# Patient Record
Sex: Male | Born: 1950 | Race: White | Hispanic: No | Smoking: Current every day smoker
Health system: Southern US, Community
[De-identification: ages and names within clinical notes are randomized; demographics above are authoritative.]

## PROBLEM LIST (undated history)

## (undated) DIAGNOSIS — E785 Hyperlipidemia, unspecified: Secondary | ICD-10-CM

## (undated) DIAGNOSIS — I252 Old myocardial infarction: Secondary | ICD-10-CM

## (undated) DIAGNOSIS — I10 Essential (primary) hypertension: Secondary | ICD-10-CM

## (undated) HISTORY — DX: Essential (primary) hypertension: I10

## (undated) HISTORY — DX: Hyperlipidemia, unspecified: E78.5

## (undated) HISTORY — DX: Old myocardial infarction: I25.2

---

## 1998-05-24 HISTORY — PX: CORONARY ANGIOPLASTY WITH STENT PLACEMENT: SHX49

## 2001-05-23 ENCOUNTER — Ambulatory Visit (HOSPITAL_COMMUNITY): Admission: RE | Admit: 2001-05-23 | Discharge: 2001-05-23 | Payer: Self-pay | Admitting: Internal Medicine

## 2001-05-24 ENCOUNTER — Encounter: Payer: Self-pay | Admitting: Internal Medicine

## 2001-08-19 ENCOUNTER — Ambulatory Visit (HOSPITAL_COMMUNITY): Admission: RE | Admit: 2001-08-19 | Discharge: 2001-08-19 | Payer: Self-pay | Admitting: Internal Medicine

## 2001-08-19 ENCOUNTER — Encounter: Payer: Self-pay | Admitting: Internal Medicine

## 2002-01-09 ENCOUNTER — Ambulatory Visit (HOSPITAL_COMMUNITY): Admission: RE | Admit: 2002-01-09 | Discharge: 2002-01-09 | Payer: Self-pay | Admitting: Internal Medicine

## 2002-08-08 ENCOUNTER — Encounter (HOSPITAL_COMMUNITY): Admission: RE | Admit: 2002-08-08 | Discharge: 2002-09-07 | Payer: Self-pay | Admitting: Pulmonary Disease

## 2002-08-17 ENCOUNTER — Encounter: Payer: Self-pay | Admitting: Emergency Medicine

## 2002-08-17 ENCOUNTER — Emergency Department (HOSPITAL_COMMUNITY): Admission: EM | Admit: 2002-08-17 | Discharge: 2002-08-17 | Payer: Self-pay | Admitting: Emergency Medicine

## 2002-11-29 ENCOUNTER — Emergency Department (HOSPITAL_COMMUNITY): Admission: EM | Admit: 2002-11-29 | Discharge: 2002-11-29 | Payer: Self-pay | Admitting: *Deleted

## 2003-04-05 ENCOUNTER — Ambulatory Visit (HOSPITAL_COMMUNITY): Admission: RE | Admit: 2003-04-05 | Discharge: 2003-04-05 | Payer: Self-pay | Admitting: Gastroenterology

## 2005-04-20 ENCOUNTER — Ambulatory Visit (HOSPITAL_COMMUNITY): Admission: RE | Admit: 2005-04-20 | Discharge: 2005-04-20 | Payer: Self-pay | Admitting: Family Medicine

## 2005-05-28 ENCOUNTER — Ambulatory Visit: Payer: Self-pay | Admitting: Internal Medicine

## 2009-01-14 HISTORY — PX: US ECHOCARDIOGRAPHY: HXRAD669

## 2010-09-14 ENCOUNTER — Encounter: Payer: Self-pay | Admitting: Family Medicine

## 2010-09-14 ENCOUNTER — Encounter: Payer: Self-pay | Admitting: Internal Medicine

## 2010-11-13 HISTORY — PX: NM MYOCAR PERF WALL MOTION: HXRAD629

## 2011-05-25 ENCOUNTER — Ambulatory Visit (HOSPITAL_COMMUNITY)
Admission: RE | Admit: 2011-05-25 | Discharge: 2011-05-25 | Disposition: A | Discharge status: Home / Self Care | Source: Ambulatory Visit | Attending: Family Medicine | Admitting: Family Medicine

## 2011-05-25 ENCOUNTER — Other Ambulatory Visit (HOSPITAL_COMMUNITY): Payer: Self-pay | Admitting: Family Medicine

## 2011-05-25 DIAGNOSIS — R52 Pain, unspecified: Secondary | ICD-10-CM

## 2011-05-25 DIAGNOSIS — M25579 Pain in unspecified ankle and joints of unspecified foot: Secondary | ICD-10-CM | POA: Insufficient documentation

## 2015-02-28 ENCOUNTER — Encounter: Payer: Self-pay | Admitting: *Deleted

## 2015-03-21 ENCOUNTER — Encounter: Payer: Self-pay | Admitting: Cardiovascular Disease

## 2016-03-24 ENCOUNTER — Encounter (HOSPITAL_COMMUNITY): Payer: Self-pay | Admitting: *Deleted

## 2016-03-24 ENCOUNTER — Emergency Department (HOSPITAL_COMMUNITY): Payer: Medicare Other | Admitting: Anesthesiology

## 2016-03-24 ENCOUNTER — Ambulatory Visit (HOSPITAL_COMMUNITY)
Admission: EM | Admit: 2016-03-24 | Discharge: 2016-03-25 | Disposition: A | Discharge status: Home / Self Care | Payer: Medicare Other | Attending: Emergency Medicine | Admitting: Emergency Medicine

## 2016-03-24 ENCOUNTER — Encounter (HOSPITAL_COMMUNITY)
Admission: EM | Disposition: A | Discharge status: Home / Self Care | Payer: Self-pay | Source: Home / Self Care | Attending: Emergency Medicine

## 2016-03-24 ENCOUNTER — Emergency Department (HOSPITAL_COMMUNITY): Payer: Medicare Other

## 2016-03-24 DIAGNOSIS — Z955 Presence of coronary angioplasty implant and graft: Secondary | ICD-10-CM | POA: Diagnosis not present

## 2016-03-24 DIAGNOSIS — S6992XA Unspecified injury of left wrist, hand and finger(s), initial encounter: Secondary | ICD-10-CM

## 2016-03-24 DIAGNOSIS — Z23 Encounter for immunization: Secondary | ICD-10-CM | POA: Diagnosis not present

## 2016-03-24 DIAGNOSIS — Y9389 Activity, other specified: Secondary | ICD-10-CM | POA: Insufficient documentation

## 2016-03-24 DIAGNOSIS — F17210 Nicotine dependence, cigarettes, uncomplicated: Secondary | ICD-10-CM | POA: Insufficient documentation

## 2016-03-24 DIAGNOSIS — Y998 Other external cause status: Secondary | ICD-10-CM | POA: Insufficient documentation

## 2016-03-24 DIAGNOSIS — E785 Hyperlipidemia, unspecified: Secondary | ICD-10-CM | POA: Diagnosis not present

## 2016-03-24 DIAGNOSIS — Z7982 Long term (current) use of aspirin: Secondary | ICD-10-CM | POA: Diagnosis not present

## 2016-03-24 DIAGNOSIS — I10 Essential (primary) hypertension: Secondary | ICD-10-CM | POA: Insufficient documentation

## 2016-03-24 DIAGNOSIS — Z79899 Other long term (current) drug therapy: Secondary | ICD-10-CM | POA: Insufficient documentation

## 2016-03-24 DIAGNOSIS — W3300XA Accidental discharge of unspecified larger firearm, initial encounter: Secondary | ICD-10-CM | POA: Diagnosis not present

## 2016-03-24 DIAGNOSIS — I252 Old myocardial infarction: Secondary | ICD-10-CM | POA: Diagnosis not present

## 2016-03-24 DIAGNOSIS — S68621A Partial traumatic transphalangeal amputation of left index finger, initial encounter: Secondary | ICD-10-CM | POA: Diagnosis not present

## 2016-03-24 HISTORY — PX: AMPUTATION: SHX166

## 2016-03-24 LAB — CBC WITH DIFFERENTIAL/PLATELET
Basophils Absolute: 0 10*3/uL (ref 0.0–0.1)
Basophils Relative: 0 %
EOS PCT: 1 %
Eosinophils Absolute: 0.1 10*3/uL (ref 0.0–0.7)
HEMATOCRIT: 40.3 % (ref 39.0–52.0)
Hemoglobin: 14 g/dL (ref 13.0–17.0)
LYMPHS ABS: 2 10*3/uL (ref 0.7–4.0)
LYMPHS PCT: 20 %
MCH: 32.5 pg (ref 26.0–34.0)
MCHC: 34.7 g/dL (ref 30.0–36.0)
MCV: 93.5 fL (ref 78.0–100.0)
MONO ABS: 0.7 10*3/uL (ref 0.1–1.0)
Monocytes Relative: 7 %
NEUTROS ABS: 7.1 10*3/uL (ref 1.7–7.7)
Neutrophils Relative %: 72 %
PLATELETS: 268 10*3/uL (ref 150–400)
RBC: 4.31 MIL/uL (ref 4.22–5.81)
RDW: 13 % (ref 11.5–15.5)
WBC: 9.9 10*3/uL (ref 4.0–10.5)

## 2016-03-24 LAB — BASIC METABOLIC PANEL
ANION GAP: 5 (ref 5–15)
BUN: 19 mg/dL (ref 6–20)
CHLORIDE: 108 mmol/L (ref 101–111)
CO2: 23 mmol/L (ref 22–32)
Calcium: 9.2 mg/dL (ref 8.9–10.3)
Creatinine, Ser: 1.13 mg/dL (ref 0.61–1.24)
GFR calc Af Amer: >60 mL/min (ref >60)
GFR calc non Af Amer: >60 mL/min (ref >60)
GLUCOSE: 130 mg/dL — AB (ref 65–99)
POTASSIUM: 3.6 mmol/L (ref 3.5–5.1)
Sodium: 136 mmol/L (ref 135–145)

## 2016-03-24 SURGERY — AMPUTATION DIGIT
Anesthesia: General | Site: Finger | Laterality: Left

## 2016-03-24 MED ORDER — DEXAMETHASONE SODIUM PHOSPHATE 10 MG/ML IJ SOLN
INTRAMUSCULAR | Status: DC | PRN
  Administered 2016-03-24: 10 mg via INTRAVENOUS

## 2016-03-24 MED ORDER — MIDAZOLAM HCL 2 MG/2ML IJ SOLN
INTRAMUSCULAR | Status: AC
  Filled 2016-03-24: qty 2

## 2016-03-24 MED ORDER — ONDANSETRON HCL 4 MG/2ML IJ SOLN
INTRAMUSCULAR | Status: AC
  Filled 2016-03-24: qty 2

## 2016-03-24 MED ORDER — TETANUS-DIPHTH-ACELL PERTUSSIS 5-2.5-18.5 LF-MCG/0.5 IM SUSP
0.5000 mL | Freq: Once | INTRAMUSCULAR | Status: AC
  Administered 2016-03-24: 0.5 mL via INTRAMUSCULAR
  Filled 2016-03-24: qty 0.5

## 2016-03-24 MED ORDER — OXYCODONE HCL 5 MG/5ML PO SOLN: 5.0000 mg | Freq: Once | ORAL | Status: DC | PRN

## 2016-03-24 MED ORDER — LIDOCAINE 2% (20 MG/ML) 5 ML SYRINGE
INTRAMUSCULAR | Status: AC
  Filled 2016-03-24: qty 5

## 2016-03-24 MED ORDER — ONDANSETRON HCL 4 MG/2ML IJ SOLN
4.0000 mg | Freq: Once | INTRAMUSCULAR | Status: AC
  Administered 2016-03-24: 4 mg via INTRAVENOUS
  Filled 2016-03-24: qty 2

## 2016-03-24 MED ORDER — MORPHINE SULFATE (PF) 4 MG/ML IV SOLN
4.0000 mg | Freq: Once | INTRAVENOUS | Status: AC
  Administered 2016-03-24: 4 mg via INTRAVENOUS
  Filled 2016-03-24: qty 1

## 2016-03-24 MED ORDER — LACTATED RINGERS IV SOLN
INTRAVENOUS | Status: DC | PRN
  Administered 2016-03-24 (×2): via INTRAVENOUS

## 2016-03-24 MED ORDER — CEFAZOLIN IN D5W 1 GM/50ML IV SOLN
INTRAVENOUS | Status: DC | PRN
  Administered 2016-03-24: 1 g via INTRAVENOUS

## 2016-03-24 MED ORDER — EPHEDRINE 5 MG/ML INJ
INTRAVENOUS | Status: AC
  Filled 2016-03-24: qty 10

## 2016-03-24 MED ORDER — PROMETHAZINE HCL 25 MG/ML IJ SOLN: 6.2500 mg | INTRAMUSCULAR | Status: DC | PRN

## 2016-03-24 MED ORDER — MIDAZOLAM HCL 2 MG/2ML IJ SOLN
INTRAMUSCULAR | Status: DC | PRN
  Administered 2016-03-24: 2 mg via INTRAVENOUS

## 2016-03-24 MED ORDER — SUCCINYLCHOLINE CHLORIDE 20 MG/ML IJ SOLN
INTRAMUSCULAR | Status: DC | PRN
  Administered 2016-03-24: 100 mg via INTRAVENOUS

## 2016-03-24 MED ORDER — CEFAZOLIN IN D5W 1 GM/50ML IV SOLN
1.0000 g | Freq: Once | INTRAVENOUS | Status: AC
  Administered 2016-03-24: 1 g via INTRAVENOUS
  Filled 2016-03-24: qty 50

## 2016-03-24 MED ORDER — CEPHALEXIN 500 MG PO CAPS: 500.0000 mg | ORAL_CAPSULE | Freq: Four times a day (QID) | ORAL | 0 refills | Status: DC

## 2016-03-24 MED ORDER — OXYCODONE HCL 5 MG PO TABS: 10.0000 mg | ORAL_TABLET | ORAL | 0 refills | Status: DC | PRN

## 2016-03-24 MED ORDER — SUCCINYLCHOLINE CHLORIDE 200 MG/10ML IV SOSY
PREFILLED_SYRINGE | INTRAVENOUS | Status: AC
  Filled 2016-03-24: qty 10

## 2016-03-24 MED ORDER — 0.9 % SODIUM CHLORIDE (POUR BTL) OPTIME
TOPICAL | Status: DC | PRN
  Administered 2016-03-24: 1000 mL

## 2016-03-24 MED ORDER — SODIUM CHLORIDE 0.9 % IR SOLN
Status: DC | PRN
  Administered 2016-03-24: 3000 mL

## 2016-03-24 MED ORDER — ONDANSETRON HCL 4 MG/2ML IJ SOLN
INTRAMUSCULAR | Status: DC | PRN
  Administered 2016-03-24: 4 mg via INTRAVENOUS

## 2016-03-24 MED ORDER — BUPIVACAINE HCL (PF) 0.25 % IJ SOLN
INTRAMUSCULAR | Status: AC
  Filled 2016-03-24: qty 30

## 2016-03-24 MED ORDER — EPHEDRINE SULFATE 50 MG/ML IJ SOLN
INTRAMUSCULAR | Status: DC | PRN
  Administered 2016-03-24 (×2): 10 mg via INTRAVENOUS
  Administered 2016-03-24: 15 mg via INTRAVENOUS

## 2016-03-24 MED ORDER — FENTANYL CITRATE (PF) 250 MCG/5ML IJ SOLN
INTRAMUSCULAR | Status: AC
  Filled 2016-03-24: qty 5

## 2016-03-24 MED ORDER — FENTANYL CITRATE (PF) 100 MCG/2ML IJ SOLN
50.0000 ug | Freq: Once | INTRAMUSCULAR | Status: AC
  Administered 2016-03-24: 50 ug via INTRAVENOUS
  Filled 2016-03-24: qty 2

## 2016-03-24 MED ORDER — LIDOCAINE HCL (CARDIAC) 20 MG/ML IV SOLN
INTRAVENOUS | Status: DC | PRN
  Administered 2016-03-24: 80 mg via INTRATRACHEAL

## 2016-03-24 MED ORDER — PROPOFOL 10 MG/ML IV BOLUS
INTRAVENOUS | Status: AC
  Filled 2016-03-24: qty 20

## 2016-03-24 MED ORDER — DEXAMETHASONE SODIUM PHOSPHATE 10 MG/ML IJ SOLN
INTRAMUSCULAR | Status: AC
  Filled 2016-03-24: qty 1

## 2016-03-24 MED ORDER — FENTANYL CITRATE (PF) 100 MCG/2ML IJ SOLN: 25.0000 ug | INTRAMUSCULAR | Status: DC | PRN

## 2016-03-24 MED ORDER — PROPOFOL 10 MG/ML IV BOLUS
INTRAVENOUS | Status: DC | PRN
  Administered 2016-03-24: 200 mg via INTRAVENOUS

## 2016-03-24 MED ORDER — FENTANYL CITRATE (PF) 250 MCG/5ML IJ SOLN
INTRAMUSCULAR | Status: DC | PRN
  Administered 2016-03-24: 50 ug via INTRAVENOUS

## 2016-03-24 MED ORDER — OXYCODONE HCL 5 MG PO TABS: 5.0000 mg | ORAL_TABLET | Freq: Once | ORAL | Status: DC | PRN

## 2016-03-24 MED ORDER — SODIUM CHLORIDE 0.9 % IV BOLUS (SEPSIS)
1000.0000 mL | Freq: Once | INTRAVENOUS | Status: AC
  Administered 2016-03-24: 1000 mL via INTRAVENOUS

## 2016-03-24 MED ORDER — BUPIVACAINE HCL (PF) 0.25 % IJ SOLN
INTRAMUSCULAR | Status: DC | PRN
Start: 2016-03-24 — End: 2016-03-24
  Administered 2016-03-24: 10 mL

## 2016-03-24 SURGICAL SUPPLY — 55 items
BANDAGE COBAN STERILE 2 (GAUZE/BANDAGES/DRESSINGS) ×2 IMPLANT
BANDAGE ELASTIC 3 VELCRO ST LF (GAUZE/BANDAGES/DRESSINGS) IMPLANT
BANDAGE ELASTIC 4 VELCRO ST LF (GAUZE/BANDAGES/DRESSINGS) IMPLANT
BLADE 15 SAFETY STRL DISP (BLADE) ×4 IMPLANT
BNDG COHESIVE 1X5 TAN STRL LF (GAUZE/BANDAGES/DRESSINGS) ×2 IMPLANT
BNDG CONFORM 2 STRL LF (GAUZE/BANDAGES/DRESSINGS) IMPLANT
BNDG GAUZE ELAST 4 BULKY (GAUZE/BANDAGES/DRESSINGS) ×4 IMPLANT
CORDS BIPOLAR (ELECTRODE) ×3 IMPLANT
COVER SURGICAL LIGHT HANDLE (MISCELLANEOUS) ×3 IMPLANT
CUFF TOURNIQUET SINGLE 18IN (TOURNIQUET CUFF) ×3 IMPLANT
CUFF TOURNIQUET SINGLE 24IN (TOURNIQUET CUFF) IMPLANT
DRAPE SURG 17X23 STRL (DRAPES) ×3 IMPLANT
DRSG ADAPTIC 3X8 NADH LF (GAUZE/BANDAGES/DRESSINGS) ×2 IMPLANT
GAUZE SPONGE 2X2 8PLY STRL LF (GAUZE/BANDAGES/DRESSINGS) IMPLANT
GAUZE SPONGE 4X4 12PLY STRL (GAUZE/BANDAGES/DRESSINGS) ×2 IMPLANT
GAUZE XEROFORM 1X8 LF (GAUZE/BANDAGES/DRESSINGS) IMPLANT
GAUZE XEROFORM 5X9 LF (GAUZE/BANDAGES/DRESSINGS) ×3 IMPLANT
GLOVE BIOGEL M 8.0 STRL (GLOVE) ×3 IMPLANT
GLOVE BIOGEL PI IND STRL 7.5 (GLOVE) IMPLANT
GLOVE BIOGEL PI INDICATOR 7.5 (GLOVE) ×2
GLOVE SS BIOGEL STRL SZ 8 (GLOVE) ×1 IMPLANT
GLOVE SUPERSENSE BIOGEL SZ 8 (GLOVE) ×2
GOWN STRL REUS W/ TWL LRG LVL3 (GOWN DISPOSABLE) ×1 IMPLANT
GOWN STRL REUS W/ TWL XL LVL3 (GOWN DISPOSABLE) ×2 IMPLANT
GOWN STRL REUS W/TWL LRG LVL3 (GOWN DISPOSABLE) ×3
GOWN STRL REUS W/TWL XL LVL3 (GOWN DISPOSABLE) ×6
KIT BASIN OR (CUSTOM PROCEDURE TRAY) ×3 IMPLANT
KIT ROOM TURNOVER OR (KITS) ×3 IMPLANT
MANIFOLD NEPTUNE II (INSTRUMENTS) ×3 IMPLANT
NDL HYPO 25GX1X1/2 BEV (NEEDLE) IMPLANT
NEEDLE HYPO 25GX1X1/2 BEV (NEEDLE) ×3 IMPLANT
NS IRRIG 1000ML POUR BTL (IV SOLUTION) ×3 IMPLANT
PACK ORTHO EXTREMITY (CUSTOM PROCEDURE TRAY) ×3 IMPLANT
PAD ARMBOARD 7.5X6 YLW CONV (MISCELLANEOUS) ×6 IMPLANT
PAD CAST 3X4 CTTN HI CHSV (CAST SUPPLIES) IMPLANT
PAD CAST 4YDX4 CTTN HI CHSV (CAST SUPPLIES) IMPLANT
PADDING CAST COTTON 3X4 STRL (CAST SUPPLIES) ×3
PADDING CAST COTTON 4X4 STRL (CAST SUPPLIES)
SET CYSTO W/LG BORE CLAMP LF (SET/KITS/TRAYS/PACK) ×2 IMPLANT
SOLUTION BETADINE 4OZ (MISCELLANEOUS) ×3 IMPLANT
SPECIMEN JAR SMALL (MISCELLANEOUS) IMPLANT
SPLINT FINGER 6.25 W/BULB ALUM (SOFTGOODS) ×2 IMPLANT
SPONGE GAUZE 2X2 STER 10/PKG (GAUZE/BANDAGES/DRESSINGS)
SPONGE GAUZE 4X4 12PLY STER LF (GAUZE/BANDAGES/DRESSINGS) ×2 IMPLANT
SPONGE SCRUB IODOPHOR (GAUZE/BANDAGES/DRESSINGS) ×3 IMPLANT
SUT MERSILENE 4 0 P 3 (SUTURE) IMPLANT
SUT PROLENE 4 0 PS 2 18 (SUTURE) IMPLANT
SUT PROLENE 5 0 PS 2 (SUTURE) ×6 IMPLANT
SYR CONTROL 10ML LL (SYRINGE) ×3 IMPLANT
TOWEL OR 17X24 6PK STRL BLUE (TOWEL DISPOSABLE) ×3 IMPLANT
TOWEL OR 17X26 10 PK STRL BLUE (TOWEL DISPOSABLE) ×3 IMPLANT
TUBE CONNECTING 12'X1/4 (SUCTIONS) ×1
TUBE CONNECTING 12X1/4 (SUCTIONS) ×1 IMPLANT
UNDERPAD 30X30 INCONTINENT (UNDERPADS AND DIAPERS) ×3 IMPLANT
WATER STERILE IRR 1000ML POUR (IV SOLUTION) ×3 IMPLANT
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
## 2016-03-24 NOTE — Anesthesia Procedure Notes (Signed)
Procedure Name: Intubation Date/Time: 03/24/2016 10:43 PM Performed by: Molli Hazard Pre-anesthesia Checklist: Patient identified, Emergency Drugs available, Suction available and Patient being monitored Patient Re-evaluated:Patient Re-evaluated prior to inductionOxygen Delivery Method: Circle system utilized Preoxygenation: Pre-oxygenation with 100% oxygen Intubation Type: IV induction, Rapid sequence and Cricoid Pressure applied Laryngoscope Size: Miller and 2 Grade View: Grade I Tube type: Oral Tube size: 7.5 mm Number of attempts: 1 Airway Equipment and Method: Stylet Placement Confirmation: ETT inserted through vocal cords under direct vision,  positive ETCO2 and breath sounds checked- equal and bilateral Secured at: 23 cm Tube secured with: Tape Dental Injury: Teeth and Oropharynx as per pre-operative assessment

## 2016-03-24 NOTE — ED Notes (Signed)
Patient requesting pain medication.
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
## 2016-03-24 NOTE — Transfer of Care (Signed)
Immediate Anesthesia Transfer of Care Note  Patient: James Ortega  Procedure(s) Performed: Procedure(s): REVISION AMPUTATION LEFT INDEX FINGER (Left)  Patient Location: PACU  Anesthesia Type:General  Level of Consciousness: awake and alert   Airway & Oxygen Therapy: Patient connected to nasal cannula oxygen  Post-op Assessment: Report given to RN, Post -op Vital signs reviewed and stable and Patient moving all extremities X 4  Post vital signs: Reviewed and stable  Last Vitals:  Vitals:   03/24/16 2045 03/24/16 2151  BP: 120/81   Pulse: 65   Resp: 13   Temp:  36.5 C    Last Pain:  Vitals:   03/24/16 2151  TempSrc: Oral  PainSc:          Complications: No apparent anesthesia complications

## 2016-03-24 NOTE — Anesthesia Preprocedure Evaluation (Addendum)
Anesthesia Evaluation  Patient identified by MRN, date of birth, ID band Patient awake    Reviewed: Allergy & Precautions, NPO status , Patient's Chart, lab work & pertinent test results  Airway Mallampati: II  TM Distance: >3 FB Neck ROM: Full    Dental  (+) Dental Advisory Given, Edentulous Lower, Partial Upper   Pulmonary Current Smoker,    breath sounds clear to auscultation       Cardiovascular hypertension, Pt. on medications and Pt. on home beta blockers + CAD and + Cardiac Stents   Rhythm:Regular Rate:Normal     Neuro/Psych    GI/Hepatic negative GI ROS, Neg liver ROS,   Endo/Other  negative endocrine ROS  Renal/GU negative Renal ROS     Musculoskeletal   Abdominal   Peds  Hematology negative hematology ROS (+)   Anesthesia Other Findings   Reproductive/Obstetrics                           Lab Results  Component Value Date   WBC 9.9 03/24/2016   HGB 14.0 03/24/2016   HCT 40.3 03/24/2016   MCV 93.5 03/24/2016   PLT 268 03/24/2016   Lab Results  Component Value Date   CREATININE 1.13 03/24/2016   BUN 19 03/24/2016   NA 136 03/24/2016   K 3.6 03/24/2016   CL 108 03/24/2016   CO2 23 03/24/2016    Anesthesia Physical Anesthesia Plan  ASA: III and emergent  Anesthesia Plan: General   Post-op Pain Management:    Induction: Intravenous and Rapid sequence  Airway Management Planned: Oral ETT  Additional Equipment:   Intra-op Plan:   Post-operative Plan: Extubation in OR  Informed Consent: I have reviewed the patients History and Physical, chart, labs and discussed the procedure including the risks, benefits and alternatives for the proposed anesthesia with the patient or authorized representative who has indicated his/her understanding and acceptance.   Dental advisory given  Plan Discussed with: CRNA  Anesthesia Plan Comments:        Anesthesia Quick  Evaluation

## 2016-03-25 ENCOUNTER — Encounter (HOSPITAL_COMMUNITY): Payer: Self-pay | Admitting: Orthopedic Surgery

## 2016-03-25 DIAGNOSIS — S68621A Partial traumatic transphalangeal amputation of left index finger, initial encounter: Secondary | ICD-10-CM | POA: Diagnosis not present
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
## 2016-03-25 NOTE — Anesthesia Postprocedure Evaluation (Signed)
Anesthesia Post Note  Patient: James Ortega  Procedure(s) Performed: Procedure(s) (LRB): REVISION AMPUTATION LEFT INDEX FINGER (Left)  Patient location during evaluation: PACU Anesthesia Type: General Level of consciousness: awake and alert Pain management: pain level controlled Vital Signs Assessment: post-procedure vital signs reviewed and stable Respiratory status: spontaneous breathing, nonlabored ventilation, respiratory function stable and patient connected to nasal cannula oxygen Cardiovascular status: blood pressure returned to baseline and stable Postop Assessment: no signs of nausea or vomiting Anesthetic complications: no    Last Vitals:  Vitals:   03/25/16 0015 03/25/16 0024  BP: 135/87 (!) 136/91  Pulse: 84 83  Resp: 16 (!) 0  Temp:  36.3 C    Last Pain:  Vitals:   03/25/16 0024  TempSrc:   PainSc: 0-No pain                 Kennieth Rad

## 2016-11-21 ENCOUNTER — Emergency Department (HOSPITAL_COMMUNITY)
Admission: EM | Admit: 2016-11-21 | Discharge: 2016-11-22 | Disposition: A | Discharge status: Home / Self Care | Payer: Medicare Other | Attending: Emergency Medicine | Admitting: Emergency Medicine

## 2016-11-21 ENCOUNTER — Encounter (HOSPITAL_COMMUNITY): Payer: Self-pay

## 2016-11-21 ENCOUNTER — Emergency Department (HOSPITAL_COMMUNITY): Payer: Medicare Other

## 2016-11-21 DIAGNOSIS — F17210 Nicotine dependence, cigarettes, uncomplicated: Secondary | ICD-10-CM | POA: Diagnosis not present

## 2016-11-21 DIAGNOSIS — S022XXA Fracture of nasal bones, initial encounter for closed fracture: Secondary | ICD-10-CM

## 2016-11-21 DIAGNOSIS — Y9389 Activity, other specified: Secondary | ICD-10-CM | POA: Insufficient documentation

## 2016-11-21 DIAGNOSIS — S0181XA Laceration without foreign body of other part of head, initial encounter: Secondary | ICD-10-CM | POA: Diagnosis not present

## 2016-11-21 DIAGNOSIS — Z79899 Other long term (current) drug therapy: Secondary | ICD-10-CM | POA: Insufficient documentation

## 2016-11-21 DIAGNOSIS — I10 Essential (primary) hypertension: Secondary | ICD-10-CM | POA: Diagnosis not present

## 2016-11-21 DIAGNOSIS — Y929 Unspecified place or not applicable: Secondary | ICD-10-CM | POA: Insufficient documentation

## 2016-11-21 DIAGNOSIS — S0990XA Unspecified injury of head, initial encounter: Secondary | ICD-10-CM

## 2016-11-21 DIAGNOSIS — Y999 Unspecified external cause status: Secondary | ICD-10-CM | POA: Insufficient documentation

## 2016-11-21 DIAGNOSIS — S0101XA Laceration without foreign body of scalp, initial encounter: Secondary | ICD-10-CM | POA: Diagnosis not present

## 2016-11-21 DIAGNOSIS — S0083XA Contusion of other part of head, initial encounter: Secondary | ICD-10-CM
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
## 2016-11-22 DIAGNOSIS — S022XXA Fracture of nasal bones, initial encounter for closed fracture: Secondary | ICD-10-CM | POA: Diagnosis not present

## 2016-11-22 MED ORDER — LIDOCAINE HCL (PF) 2 % IJ SOLN
10.0000 mL | Freq: Once | INTRAMUSCULAR | Status: DC
  Filled 2016-11-22: qty 10

## 2016-11-22 MED ORDER — HYDROCODONE-ACETAMINOPHEN 5-325 MG PO TABS
2.0000 | ORAL_TABLET | Freq: Once | ORAL | Status: AC
  Administered 2016-11-22: 2 via ORAL
  Filled 2016-11-22: qty 2

## 2016-11-22 MED ORDER — POVIDONE-IODINE 10 % EX SOLN
CUTANEOUS | Status: AC
  Filled 2016-11-22: qty 118

## 2016-11-22 MED ORDER — HYDROCODONE-ACETAMINOPHEN 5-325 MG PO TABS: 2.0000 | ORAL_TABLET | ORAL | 0 refills | Status: DC | PRN
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
## 2016-11-22 NOTE — ED Notes (Signed)
EDP at bedside  

## 2016-11-22 NOTE — ED Notes (Signed)
Pt ambulatory to waiting room with steady gait. NAD. Pt verbalized understanding of discharge instructions.

## 2016-11-22 NOTE — Discharge Instructions (Signed)
Return if any problems.

## 2018-05-10 ENCOUNTER — Ambulatory Visit: Admitting: Cardiovascular Disease

## 2018-05-11 ENCOUNTER — Ambulatory Visit (INDEPENDENT_AMBULATORY_CARE_PROVIDER_SITE_OTHER): Payer: Medicare Other | Admitting: Cardiovascular Disease

## 2018-05-11 ENCOUNTER — Encounter: Payer: Self-pay | Admitting: Cardiovascular Disease

## 2018-05-11 VITALS — BP 110/80 | HR 65 | Ht 70.0 in | Wt 168.5 lb

## 2018-05-11 DIAGNOSIS — I10 Essential (primary) hypertension: Secondary | ICD-10-CM | POA: Diagnosis not present

## 2018-05-11 DIAGNOSIS — I2510 Atherosclerotic heart disease of native coronary artery without angina pectoris: Secondary | ICD-10-CM | POA: Insufficient documentation

## 2018-05-11 DIAGNOSIS — I255 Ischemic cardiomyopathy: Secondary | ICD-10-CM | POA: Insufficient documentation

## 2018-05-11 DIAGNOSIS — Z720 Tobacco use: Secondary | ICD-10-CM | POA: Insufficient documentation

## 2018-05-11 DIAGNOSIS — Z23 Encounter for immunization: Secondary | ICD-10-CM

## 2018-05-11 DIAGNOSIS — E785 Hyperlipidemia, unspecified: Secondary | ICD-10-CM | POA: Insufficient documentation

## 2018-05-11 DIAGNOSIS — I519 Heart disease, unspecified: Secondary | ICD-10-CM

## 2018-05-11 DIAGNOSIS — E7800 Pure hypercholesterolemia, unspecified: Secondary | ICD-10-CM | POA: Diagnosis not present

## 2018-05-11 NOTE — Patient Instructions (Signed)
Medication Instructions:  Your physician recommends that you continue on your current medications as directed. Please refer to the Current Medication list given to you today.   Labwork: none  Testing/Procedures: Your physician has requested that you have an echocardiogram. Echocardiography is a painless test that uses sound waves to create images of your heart. It provides your doctor with information about the size and shape of your heart and how well your heart's chambers and valves are working. This procedure takes approximately one hour. There are no restrictions for this procedure.    Follow-Up: Your physician wants you to follow-up in: 12 months with Dr. Berry. You will receive a reminder letter in the mail two months in advance. If you don't receive a letter, please call our office to schedule the follow-up appointment.   Any Other Special Instructions Will Be Listed Below (If Applicable).     If you need a refill on your cardiac medications before your next appointment, please call your pharmacy.   

## 2018-05-11 NOTE — Assessment & Plan Note (Signed)
History of hyperlipidemia on statin therapy. 

## 2018-05-11 NOTE — Progress Notes (Signed)
05/11/2018 James Ortega   1950/09/08  [id number]  Primary Physician Oval Linsey, MD Primary Cardiologist: Runell Gess MD Nicholes Calamity, [place]  HPI:  James Ortega is a 67 y.o. thin appearing married Caucasian male father of 2 children who is retired Emergency planning/management officer and prison guard who was in [employer] as well.  He is self-referred to be established to my practice is a former patient.  His risk factors include 50 pack years tobacco abuse continue to smoke 1 pack/day, treated hypertension and hyperlipidemia.  He had anterior MI 1999 in [place] him and had an LAD stent at that time.  There is no family history of heart disease.  He denies chest pain or shortness of breath.  2D echo performed 01/14/2009 revealed EF of 45 to 50% with moderate to severe apical hypokinesia and a Myoview performed 11/13/2010 showed anteroapical scar without ischemia.   Current Meds  Medication Sig  . amLODipine (NORVASC) 5 MG tablet Take 5 mg by mouth daily.  Marland Kitchen apixaban (ELIQUIS) 5 MG TABS tablet Take 5 mg by mouth 2 (two) times daily.  . cephALEXin (KEFLEX) 500 MG capsule Take 1 capsule (500 mg total) by mouth 4 (four) times daily.  . clonazePAM (KLONOPIN) 1 MG tablet Take 1 mg by mouth 2 (two) times daily as needed for anxiety.  Marland Kitchen escitalopram (LEXAPRO) 10 MG tablet Take 10 mg by mouth daily.  . metoprolol succinate (TOPROL-XL) 50 MG 24 hr tablet Take 50 mg by mouth daily. Take with or immediately following a meal.  . omeprazole (PRILOSEC) 20 MG capsule Take 20 mg by mouth daily.  . rosuvastatin (CRESTOR) 20 MG tablet Take 20 mg by mouth daily.  . vitamin E 400 UNIT capsule Take 800 Units by mouth daily.  . [DISCONTINUED] aspirin 81 MG tablet Take 81 mg by mouth daily.  . [DISCONTINUED] HYDROcodone-acetaminophen (NORCO/VICODIN) 5-325 MG tablet Take 2 tablets by mouth every 4 (four) hours as needed.  . [DISCONTINUED] olmesartan (BENICAR) 40 MG tablet Take 40 mg by mouth daily.  . [DISCONTINUED]  oxyCODONE (ROXICODONE) 5 MG immediate release tablet Take 2 tablets (10 mg total) by mouth every 4 (four) hours as needed for severe pain.     No Known Allergies  Social History   Socioeconomic History  . Marital status: Married    Spouse name: Not on file  . Number of children: Not on file  . Years of education: Not on file  . Highest education level: Not on file  Occupational History  . Not on file  Social Needs  . Financial resource strain: Not on file  . Food insecurity:    Worry: Not on file    Inability: Not on file  . Transportation needs:    Medical: Not on file    Non-medical: Not on file  Tobacco Use  . Smoking status: Current Every Day Smoker    Packs/day: 1.00    Types: Cigarettes  . Smokeless tobacco: Never Used  Substance and Sexual Activity  . Alcohol use: No    Alcohol/week: 0.0 standard drinks  . Drug use: No  . Sexual activity: Yes  Lifestyle  . Physical activity:    Days per week: Not on file    Minutes per session: Not on file  . Stress: Not on file  Relationships  . Social connections:    Talks on phone: Not on file    Gets together: Not on file    Attends  religious service: Not on file    Active member of club or organization: Not on file    Attends meetings of clubs or organizations: Not on file    Relationship status: Not on file  . Intimate partner violence:    Fear of current or ex partner: Not on file    Emotionally abused: Not on file    Physically abused: Not on file    Forced sexual activity: Not on file  Other Topics Concern  . Not on file  Social History Narrative  . Not on file     Review of Systems: General: negative for chills, fever, night sweats or weight changes.  Cardiovascular: negative for chest pain, dyspnea on exertion, edema, orthopnea, palpitations, paroxysmal nocturnal dyspnea or shortness of breath Dermatological: negative for rash Respiratory: negative for cough or wheezing Urologic: negative for  hematuria Abdominal: negative for nausea, vomiting, diarrhea, bright red blood per rectum, melena, or hematemesis Neurologic: negative for visual changes, syncope, or dizziness All other systems reviewed and are otherwise negative except as noted above.    Blood pressure 110/80, pulse 65, height 5\' 10"  (1.778 m), weight 168 lb 8 oz (76.4 kg).  General appearance: alert and no distress Neck: no adenopathy, no carotid bruit, no JVD, supple, symmetrical, trachea midline and thyroid not enlarged, symmetric, no tenderness/mass/nodules Lungs: clear to auscultation bilaterally Heart: regular rate and rhythm, S1, S2 normal, no murmur, click, rub or gallop Extremities: extremities normal, atraumatic, no cyanosis or edema Pulses: 2+ and symmetric Skin: Skin color, texture, turgor normal. No rashes or lesions Neurologic: Alert and oriented X 3, normal strength and tone. Normal symmetric reflexes. Normal coordination and gait  EKG sinus rhythm at 65 with Q waves in V2 through V6, anterolateral T wave inversion with left anterior fascicular block and low limb voltage.  I personally reviewed this EKG.  ASSESSMENT AND PLAN:   Coronary artery disease History of CAD status post anterior MI 1999 treated with [place] stenting [place].  He had a Myoview performed 11/13/2010 that showed apical scar and 2D echo revealed EF of 45 to 50% with moderate to severe apical hypokinesia.  He denies chest pain or shortness of breath.  Ischemic cardiomyopathy History of LAD infarct 20 years ago in 1999 with LAD stenting.  Prior echo performed 01/14/2009 which revealed EF of 45 to 50% with an apical wall motion normality.  We will repeat a 2D echocardiogram.  Essential hypertension History of essential hypertension with blood pressure measured today at 110/80.  He is on amlodipine, metoprolol and lisinopril.  Hyperlipidemia History of hyperlipidemia on statin therapy.      Runell GessJonathan J. Leonna Schlee MD FACP,FACC,FAHA,  [facility] 05/11/2018 4:17 PM

## 2018-05-11 NOTE — Assessment & Plan Note (Signed)
History of essential hypertension with blood pressure measured today at 110/80.  He is on amlodipine, metoprolol and lisinopril.

## 2018-05-11 NOTE — Assessment & Plan Note (Signed)
History of LAD infarct 20 years ago in 1999 with LAD stenting.  Prior echo performed 01/14/2009 which revealed EF of 45 to 50% with an apical wall motion normality.  We will repeat a 2D echocardiogram.

## 2018-05-11 NOTE — Assessment & Plan Note (Signed)
History of CAD status post anterior MI 1999 treated with [place] stenting in [place].  He had a Myoview performed 11/13/2010 that showed apical scar and 2D echo revealed EF of 45 to 50% with moderate to severe apical hypokinesia.  He denies chest pain or shortness of breath.

## 2018-05-26 ENCOUNTER — Other Ambulatory Visit (HOSPITAL_COMMUNITY): Payer: Medicare Other

## 2018-06-09 ENCOUNTER — Other Ambulatory Visit: Payer: Self-pay

## 2018-06-09 ENCOUNTER — Ambulatory Visit (HOSPITAL_COMMUNITY): Payer: Medicare Other | Attending: Cardiovascular Disease

## 2018-06-09 DIAGNOSIS — I255 Ischemic cardiomyopathy: Secondary | ICD-10-CM | POA: Insufficient documentation

## 2018-06-09 DIAGNOSIS — I2510 Atherosclerotic heart disease of native coronary artery without angina pectoris: Secondary | ICD-10-CM | POA: Insufficient documentation

## 2018-06-09 DIAGNOSIS — Z720 Tobacco use: Secondary | ICD-10-CM

## 2018-06-09 DIAGNOSIS — E7800 Pure hypercholesterolemia, unspecified: Secondary | ICD-10-CM | POA: Diagnosis present

## 2018-06-09 DIAGNOSIS — I519 Heart disease, unspecified: Secondary | ICD-10-CM

## 2018-06-09 DIAGNOSIS — I10 Essential (primary) hypertension: Secondary | ICD-10-CM | POA: Insufficient documentation

## 2018-07-07 ENCOUNTER — Telehealth: Payer: Self-pay | Admitting: Cardiovascular Disease

## 2018-07-07 NOTE — Telephone Encounter (Signed)
   [facility] Pre-operative Risk Assessment    Request for surgical clearance:  1. What type of surgery is being performed? Cataract extraction with intraocular lens implantation of the right eye followed by the left eye   2. When is this surgery scheduled? Right - 07/14/18, Left - 07/28/18   3. What type of clearance is required (medical clearance vs. Pharmacy clearance to hold med vs. Both)? Medical   4. Are there any medications that need to be held prior to surgery and how long? None - patient is on eliquis    5. Practice name and name of physician performing surgery? Dr. Darleen Crocker @ [facility]   6. What is your office phone number [phone]    7.   What is your office fax number [phone]  8.   Anesthesia type (None, local, MAC, general) ? Topical anesthesia with IV medication    Fidel Levy 07/07/2018, 2:06 PM  _________________________________________________________________   (provider comments below)

## 2018-07-07 NOTE — Telephone Encounter (Signed)
Pharm, please clarify on Eliquis thanks

## 2018-07-08 NOTE — Telephone Encounter (Signed)
Recommend continuing Eliquis for cataract removal per protocol. If the following lens implantation carries additional bleeding risk aside from regular cataract removal, would be ok to hold Eliquis for 1 day prior to procedure as he takes Eliquis for afib with low CHADS2VASc score of 3 (age, HTN, CAD).

## 2018-07-08 NOTE — Telephone Encounter (Signed)
   Primary Cardiologist: No primary care provider on file.  Chart reviewed as part of pre-operative protocol coverage. Given past medical history and time since last visit, based on ACC/AHA guidelines, James Ortega would be at acceptable risk for the planned procedure without further cardiovascular testing.   I will route this recommendation to the requesting party via Epic fax function and remove from pre-op pool.  Please call with questions.  Nada BoozerLaura Cattaleya Wien, NP 07/08/2018, 9:49 AM

## 2018-08-02 ENCOUNTER — Encounter: Payer: Self-pay | Admitting: Gastroenterology

## 2018-08-10 ENCOUNTER — Ambulatory Visit: Payer: Medicare Other | Admitting: Nurse Practitioner

## 2018-09-21 ENCOUNTER — Ambulatory Visit: Payer: Medicare Other | Admitting: Gastroenterology

## 2018-11-09 ENCOUNTER — Ambulatory Visit: Payer: Medicare Other | Admitting: Nurse Practitioner
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
## 2019-06-07 ENCOUNTER — Telehealth: Payer: Self-pay | Admitting: *Deleted

## 2019-06-07 NOTE — Telephone Encounter (Signed)
A message was left, re: his follow up visit. 

## 2020-01-18 ENCOUNTER — Telehealth: Payer: Self-pay | Admitting: Cardiovascular Disease

## 2020-01-18 NOTE — Telephone Encounter (Signed)
New Message  Patient states that he would like to switch his heart care from Dr. Allyson Sabal to one of the Doctors in the [place] office. Patient lives in [place] and also wants to just start over with a new cardiologist. Please confirm that this transfer of care is ok.

## 2020-01-18 NOTE — Telephone Encounter (Signed)
Left spine with

## 2020-01-19 NOTE — Telephone Encounter (Signed)
Ok with me 

## 2020-01-23 NOTE — Telephone Encounter (Signed)
Follow up  Called patient to schedule appointment with Dr. Purvis Sheffield via approved provider switch. Called twice, no answer, unable to leave vm. When patient calls back, please schedule with Dr. Purvis Sheffield in the [place] office.

## 2020-02-20 ENCOUNTER — Ambulatory Visit (INDEPENDENT_AMBULATORY_CARE_PROVIDER_SITE_OTHER): Payer: Medicare Other | Admitting: Internal Medicine

## 2020-03-01 ENCOUNTER — Ambulatory Visit: Payer: Medicare Other | Admitting: Cardiovascular Disease

## 2021-02-11 ENCOUNTER — Telehealth (INDEPENDENT_AMBULATORY_CARE_PROVIDER_SITE_OTHER): Payer: Self-pay

## 2021-02-11 ENCOUNTER — Other Ambulatory Visit (INDEPENDENT_AMBULATORY_CARE_PROVIDER_SITE_OTHER): Payer: Self-pay | Admitting: Internal Medicine

## 2021-02-11 NOTE — Telephone Encounter (Signed)
Call back; notified pt of denied appt. Refer him to [facility]  & given phone number to call now to schedule a appointment.

## 2021-02-11 NOTE — Telephone Encounter (Signed)
Patient was released from Dr. Allyson Sabal last year in May (see below) due to location convenience. He never got an opportunity to see Dr. Purvis Sheffield. Are one of you willing to follow up with him? Please advise when able.

## 2021-02-11 NOTE — Telephone Encounter (Signed)
I have reviewed the chart.  I cannot accept this patient into the practice.

## 2021-02-13 ENCOUNTER — Telehealth: Payer: Self-pay

## 2021-02-13 NOTE — Telephone Encounter (Signed)
error 

## 2021-02-14 ENCOUNTER — Telehealth: Payer: Self-pay | Admitting: Cardiovascular Disease

## 2021-02-14 NOTE — Telephone Encounter (Signed)
Patient would like to do a provider switch to Dr. Jens Som. Please advise

## 2021-02-18 NOTE — Telephone Encounter (Signed)
Follow up scheduled

## 2021-03-10 ENCOUNTER — Ambulatory Visit: Payer: Medicare Other | Admitting: Internal Medicine

## 2021-04-14 ENCOUNTER — Ambulatory Visit (INDEPENDENT_AMBULATORY_CARE_PROVIDER_SITE_OTHER): Payer: Medicare Other | Admitting: Internal Medicine

## 2021-04-14 ENCOUNTER — Encounter: Payer: Self-pay | Admitting: Internal Medicine

## 2021-04-14 ENCOUNTER — Other Ambulatory Visit: Payer: Self-pay

## 2021-04-14 VITALS — BP 134/85 | HR 95 | Temp 98.3°F | Ht 70.0 in | Wt 159.0 lb

## 2021-04-14 DIAGNOSIS — E782 Mixed hyperlipidemia: Secondary | ICD-10-CM

## 2021-04-14 DIAGNOSIS — I10 Essential (primary) hypertension: Secondary | ICD-10-CM

## 2021-04-14 DIAGNOSIS — N1831 Chronic kidney disease, stage 3a: Secondary | ICD-10-CM

## 2021-04-14 DIAGNOSIS — I2510 Atherosclerotic heart disease of native coronary artery without angina pectoris: Secondary | ICD-10-CM | POA: Diagnosis not present

## 2021-04-14 DIAGNOSIS — Z1159 Encounter for screening for other viral diseases: Secondary | ICD-10-CM

## 2021-04-14 DIAGNOSIS — F418 Other specified anxiety disorders: Secondary | ICD-10-CM | POA: Insufficient documentation

## 2021-04-14 DIAGNOSIS — F17211 Nicotine dependence, cigarettes, in remission: Secondary | ICD-10-CM

## 2021-04-14 DIAGNOSIS — I255 Ischemic cardiomyopathy: Secondary | ICD-10-CM

## 2021-04-14 DIAGNOSIS — Z7689 Persons encountering health services in other specified circumstances: Secondary | ICD-10-CM | POA: Diagnosis not present

## 2021-04-14 DIAGNOSIS — F3341 Major depressive disorder, recurrent, in partial remission: Secondary | ICD-10-CM | POA: Insufficient documentation

## 2021-04-14 DIAGNOSIS — Z122 Encounter for screening for malignant neoplasm of respiratory organs: Secondary | ICD-10-CM

## 2021-04-14 NOTE — Assessment & Plan Note (Addendum)
Echo reviewed from 2017 It is unclear why he was put on Eliquis. He states that he had LA thrombus, but Echo did not show it. His previous PCP mentioned about it. No known h/o paroxysmal A Fib. Referred to Cardiology

## 2021-04-14 NOTE — Progress Notes (Signed)
New Patient Office Visit  Subjective:  Patient ID: James Ortega, male    DOB: Jan 13, 1951  Age: 70 y.o. MRN: [id number]  CC:  Chief Complaint  Patient presents with   Establish Care    HPI James Ortega is a 70 year old male with PMH of CAD s/p stent placement, ischemic cardiomyopathy, HTN, CKD stage 3a, depression with anxiety and tobacco abuse who presents for establishing care. He is a former patient of Dr Cindie Laroche.  BP is well-controlled. Takes medications regularly. Patient denies headache, dizziness, chest pain, dyspnea or palpitations.  His cardiac history includes CAD s/p stent placement in 1999 in [place]. He has been on Metoprolol and statin. Instead of Aspirin, he has been on Eliquis and reports that he had blood clot in his heart although Echo in 2017 did not show any blood clot. Denies any history of irregular heart rhythm. He used to Film/video editor, but wants to see a provider in [place].  He has been on Lexapro and PRN Valium for depression with anxiety. He states that he has been having trouble sleeping since his new Psychiatry NP switched him from Klonopin to Valium. Denies any SI or HI.  He has been trying to cut down smoking and now smokes about 0.5 pack/day. He smoked 1 pack/day for more than 40 years.  He has had 2 doses of COVID vaccine. Reports that he has had Pneumococcal and Shingrix vaccines at Pharmacy.  Past Medical History:  Diagnosis Date   History of acute anterior wall MI    Hyperlipidemia    Hypertension     Past Surgical History:  Procedure Laterality Date   AMPUTATION Left 03/24/2016   Procedure: REVISION AMPUTATION LEFT INDEX FINGER;  Surgeon: Roseanne Kaufman, MD;  Location: [facility];  Service: Orthopedics;  Laterality: Left;   CORONARY ANGIOPLASTY WITH STENT PLACEMENT  05/1998   bare metal stent [facility] [place]   NM MYOCAR PERF WALL MOTION  11/13/2010   No significant ischemia demonstrated. Low risk scan   US  ECHOCARDIOGRAPHY  01/14/2009   Moderate to severe apical wall hypokinesis, trace TR,MR, doppler suggestive of impaired LV relaxation    Family History  Problem Relation Age of Onset   Diabetes Mother    Diabetes Maternal Grandmother    Diabetes Maternal Grandfather     Social History   Socioeconomic History   Marital status: Married    Spouse name: Not on file   Number of children: Not on file   Years of education: Not on file   Highest education level: Not on file  Occupational History   Not on file  Tobacco Use   Smoking status: Every Day    Packs/day: 1.00    Types: Cigarettes   Smokeless tobacco: Never  Substance and Sexual Activity   Alcohol use: No    Alcohol/week: 0.0 standard drinks   Drug use: No   Sexual activity: Yes  Other Topics Concern   Not on file  Social History Narrative   Not on file   Social Determinants of Health   Financial Resource Strain: Not on file  Food Insecurity: Not on file  Transportation Needs: Not on file  Physical Activity: Not on file  Stress: Not on file  Social Connections: Not on file  Intimate Partner Violence: Not on file    ROS Review of Systems  Constitutional:  Positive for unexpected weight change. Negative for chills and fever.  HENT:  Positive for dental problem. Negative for  congestion and sore throat.   Eyes:  Negative for pain and discharge.  Respiratory:  Negative for cough and shortness of breath.   Cardiovascular:  Negative for chest pain and palpitations.  Gastrointestinal:  Negative for constipation, diarrhea, nausea and vomiting.  Endocrine: Negative for polydipsia and polyuria.  Genitourinary:  Negative for dysuria and hematuria.  Musculoskeletal:  Negative for neck pain and neck stiffness.  Skin:  Negative for rash.  Neurological:  Negative for dizziness, weakness, numbness and headaches.  Psychiatric/Behavioral:  Positive for sleep disturbance. Negative for agitation and behavioral problems. The  patient is nervous/anxious.    Objective:   Today's Vitals: BP 134/85   Pulse 95   Temp 98.3 F (36.8 C)   Ht $R'5\' 10"'Uq$  (1.778 m)   Wt 159 lb (72.1 kg)   SpO2 95%   BMI 22.81 kg/m   Physical Exam Vitals reviewed.  Constitutional:      General: He is not in acute distress.    Appearance: He is not diaphoretic.  HENT:     Head: Normocephalic and atraumatic.     Nose: Nose normal.     Mouth/Throat:     Mouth: Mucous membranes are moist.  Eyes:     General: No scleral icterus.    Extraocular Movements: Extraocular movements intact.     Pupils: Pupils are equal, round, and reactive to light.  Cardiovascular:     Rate and Rhythm: Normal rate and regular rhythm.     Pulses: Normal pulses.     Heart sounds: Normal heart sounds. No murmur heard. Pulmonary:     Breath sounds: Normal breath sounds. No wheezing or rales.  Abdominal:     Palpations: Abdomen is soft.     Tenderness: There is no abdominal tenderness.  Musculoskeletal:     Cervical back: Neck supple. No tenderness.     Right lower leg: No edema.     Left lower leg: No edema.  Skin:    General: Skin is warm.     Findings: No rash.  Neurological:     General: No focal deficit present.     Mental Status: He is alert and oriented to person, place, and time.     Sensory: No sensory deficit.     Motor: No weakness.  Psychiatric:        Mood and Affect: Mood normal.        Behavior: Behavior normal.    Assessment & Plan:   Problem List Items Addressed This Visit       Encounter to establish care - Primary   Care established Previous chart reviewed History and medications reviewed with the patient     Relevant Orders  CMP14+EGFR    Cardiovascular and Mediastinum   Coronary artery disease    H/o MI (1999) treated with LAD stenting in [place] On B-blocker On Eliquis (?) Referred to Cardiology      Relevant Orders   Ambulatory referral to Cardiology   Lipid Profile   CBC with Differential   Ischemic  cardiomyopathy    Echo reviewed from 2017 It is unclear why he was put on Eliquis. He states that he had LA thrombus, but Echo did not show it. His previous PCP mentioned about it. No known h/o paroxysmal A Fib. Referred to Cardiology      Essential hypertension    BP Readings from Last 1 Encounters:  04/14/21 134/85  Well-controlled with Lisinopril, Amlodipine and Metoprolol Counseled for compliance with the medications Advised DASH diet  and moderate exercise/walking, at least 150 mins/week       Relevant Orders   CMP14+EGFR   CBC with Differential     Genitourinary   Stage 3a chronic kidney disease ([place])    Last BMP reviewed from 07/2020 Check CMP today        Other   Hyperlipidemia    On Crestor      Relevant Orders   Lipid Profile               Depression with anxiety    On Lexapro and PRN Valium Was on Klonopin, recently switched to Valium by his new Psychiatry NP      Relevant Medications   diazepam (VALIUM) 2 MG tablet   Cigarette nicotine dependence in remission    Smokes about 0.5 pack/day Used to smoke 1 pack/day for more than 40 years  Asked about quitting: confirms that he currently smokes cigarettes Advise to quit smoking: Educated about QUITTING to reduce the risk of cancer, cardio and cerebrovascular disease. Assess willingness: Unwilling to quit at this time, but is working on cutting back. Assist with counseling and pharmacotherapy: Counseled for 5 minutes and literature provided. Arrange for follow up: Follow up in 3 months and continue to offer help.      Relevant Orders   CT CHEST LUNG CA SCREEN LOW DOSE W/O CM   Other Visit Diagnoses     Screening for lung cancer       Need for hepatitis C screening test       Relevant Orders   Hepatitis C Antibody   Screening for malignant neoplasm of respiratory organ       Relevant Orders   CT CHEST LUNG CA SCREEN LOW DOSE W/O CM       Outpatient Encounter Medications as of 04/14/2021   Medication Sig   diazepam (VALIUM) 2 MG tablet Take 2 mg by mouth every 8 (eight) hours as needed.   amLODipine (NORVASC) 5 MG tablet Take 5 mg by mouth daily.   apixaban (ELIQUIS) 5 MG TABS tablet Take 5 mg by mouth 2 (two) times daily.   escitalopram (LEXAPRO) 10 MG tablet Take 10 mg by mouth daily.   lisinopril (PRINIVIL,ZESTRIL) 40 MG tablet    metoprolol succinate (TOPROL-XL) 50 MG 24 hr tablet Take 50 mg by mouth daily. Take with or immediately following a meal.   omeprazole (PRILOSEC) 20 MG capsule Take 20 mg by mouth daily.   rosuvastatin (CRESTOR) 20 MG tablet Take 20 mg by mouth daily.   vitamin E 400 UNIT capsule Take 800 Units by mouth daily.   [DISCONTINUED] cephALEXin (KEFLEX) 500 MG capsule Take 1 capsule (500 mg total) by mouth 4 (four) times daily.   [DISCONTINUED] clonazePAM (KLONOPIN) 1 MG tablet Take 1 mg by mouth 2 (two) times daily as needed for anxiety.   No facility-administered encounter medications on file as of 04/14/2021.    Follow-up: Return in about 4 months (around 08/14/2021) for HTN, CAD and CKD.   Lindell Spar, MD

## 2021-04-14 NOTE — Assessment & Plan Note (Signed)
Smokes about 0.5 pack/day Used to smoke 1 pack/day for more than 40 years  Asked about quitting: confirms that he currently smokes cigarettes Advise to quit smoking: Educated about QUITTING to reduce the risk of cancer, cardio and cerebrovascular disease. Assess willingness: Unwilling to quit at this time, but is working on cutting back. Assist with counseling and pharmacotherapy: Counseled for 5 minutes and literature provided. Arrange for follow up: Follow up in 3 months and continue to offer help.

## 2021-04-14 NOTE — Assessment & Plan Note (Signed)
On Crestor 

## 2021-04-14 NOTE — Assessment & Plan Note (Signed)
BP Readings from Last 1 Encounters:  04/14/21 134/85   Well-controlled with Lisinopril, Amlodipine and Metoprolol Counseled for compliance with the medications Advised DASH diet and moderate exercise/walking, at least 150 mins/week

## 2021-04-14 NOTE — Assessment & Plan Note (Signed)
Care established Previous chart reviewed History and medications reviewed with the patient 

## 2021-04-14 NOTE — Assessment & Plan Note (Signed)
H/o MI (1999) treated with LAD stenting in [place] On B-blocker On Eliquis (?) Referred to Cardiology

## 2021-04-14 NOTE — Assessment & Plan Note (Signed)
Last BMP reviewed from 07/2020 Check CMP today

## 2021-04-14 NOTE — Assessment & Plan Note (Addendum)
On Lexapro and PRN Valium Was on Klonopin, recently switched to Valium by his new Psychiatry NP

## 2021-04-14 NOTE — Patient Instructions (Signed)
Please continue taking medications as prescribed.  You are being referred to Cardiology office in [place].  Please continue to follow low salt diet and ambulate as tolerated.

## 2021-04-15 ENCOUNTER — Other Ambulatory Visit: Payer: Self-pay

## 2021-04-15 ENCOUNTER — Telehealth: Payer: Self-pay

## 2021-04-15 DIAGNOSIS — I255 Ischemic cardiomyopathy: Secondary | ICD-10-CM

## 2021-04-15 DIAGNOSIS — I10 Essential (primary) hypertension: Secondary | ICD-10-CM

## 2021-04-15 DIAGNOSIS — F418 Other specified anxiety disorders: Secondary | ICD-10-CM

## 2021-04-15 DIAGNOSIS — K219 Gastro-esophageal reflux disease without esophagitis: Secondary | ICD-10-CM

## 2021-04-15 DIAGNOSIS — E782 Mixed hyperlipidemia: Secondary | ICD-10-CM

## 2021-04-15 LAB — CMP14+EGFR
ALT: 20 IU/L (ref 0–44)
AST: 24 IU/L (ref 0–40)
Albumin/Globulin Ratio: 2.4 — ABNORMAL HIGH (ref 1.2–2.2)
Albumin: 4.7 g/dL (ref 3.8–4.8)
Alkaline Phosphatase: 62 IU/L (ref 44–121)
BUN/Creatinine Ratio: 15 (ref 10–24)
BUN: 19 mg/dL (ref 8–27)
Bilirubin Total: 0.5 mg/dL (ref 0.0–1.2)
CO2: 25 mmol/L (ref 20–29)
Calcium: 10.1 mg/dL (ref 8.6–10.2)
Chloride: 106 mmol/L (ref 96–106)
Creatinine, Ser: 1.28 mg/dL — ABNORMAL HIGH (ref 0.76–1.27)
Globulin, Total: 2 g/dL (ref 1.5–4.5)
Glucose: 90 mg/dL (ref 65–99)
Potassium: 3.9 mmol/L (ref 3.5–5.2)
Sodium: 146 mmol/L — ABNORMAL HIGH (ref 134–144)
Total Protein: 6.7 g/dL (ref 6.0–8.5)
eGFR: 61 mL/min/{1.73_m2} (ref >59)

## 2021-04-15 LAB — LIPID PANEL
Chol/HDL Ratio: 3.5 ratio (ref 0.0–5.0)
Cholesterol, Total: 149 mg/dL (ref 100–199)
HDL: 43 mg/dL (ref >39)
LDL Chol Calc (NIH): 78 mg/dL (ref 0–99)
Triglycerides: 162 mg/dL — ABNORMAL HIGH (ref 0–149)
VLDL Cholesterol Cal: 28 mg/dL (ref 5–40)

## 2021-04-15 LAB — CBC WITH DIFFERENTIAL/PLATELET
Basophils Absolute: 0.1 10*3/uL (ref 0.0–0.2)
Basos: 1 %
EOS (ABSOLUTE): 0.1 10*3/uL (ref 0.0–0.4)
Eos: 1 %
Hematocrit: 41.6 % (ref 37.5–51.0)
Hemoglobin: 14 g/dL (ref 13.0–17.7)
Immature Grans (Abs): 0 10*3/uL (ref 0.0–0.1)
Immature Granulocytes: 0 %
Lymphocytes Absolute: 2 10*3/uL (ref 0.7–3.1)
Lymphs: 16 %
MCH: 31.8 pg (ref 26.6–33.0)
MCHC: 33.7 g/dL (ref 31.5–35.7)
MCV: 95 fL (ref 79–97)
Monocytes Absolute: 0.9 10*3/uL (ref 0.1–0.9)
Monocytes: 7 %
Neutrophils Absolute: 9.3 10*3/uL — ABNORMAL HIGH (ref 1.4–7.0)
Neutrophils: 75 %
Platelets: 282 10*3/uL (ref 150–450)
RBC: 4.4 x10E6/uL (ref 4.14–5.80)
RDW: 13.6 % (ref 11.6–15.4)
WBC: 12.4 10*3/uL — ABNORMAL HIGH (ref 3.4–10.8)

## 2021-04-15 LAB — HEPATITIS C ANTIBODY: Hep C Virus Ab: <0.1 s/co ratio (ref 0.0–0.9)

## 2021-04-15 MED ORDER — APIXABAN 5 MG PO TABS: 5.0000 mg | ORAL_TABLET | Freq: Two times a day (BID) | ORAL | 0 refills | Status: DC

## 2021-04-15 MED ORDER — ROSUVASTATIN CALCIUM 20 MG PO TABS
20.0000 mg | ORAL_TABLET | Freq: Every day | ORAL | 0 refills | Status: DC
Start: 2021-04-15 — End: 2021-06-03

## 2021-04-15 MED ORDER — DIAZEPAM 2 MG PO TABS: 2.0000 mg | ORAL_TABLET | Freq: Three times a day (TID) | ORAL | 0 refills | Status: DC | PRN

## 2021-04-15 MED ORDER — LISINOPRIL 40 MG PO TABS: 40.0000 mg | ORAL_TABLET | Freq: Every day | ORAL | 0 refills | Status: DC

## 2021-04-15 MED ORDER — METOPROLOL SUCCINATE ER 50 MG PO TB24: 50.0000 mg | ORAL_TABLET | Freq: Every day | ORAL | 0 refills | Status: DC

## 2021-04-15 MED ORDER — OMEPRAZOLE 20 MG PO CPDR: 20.0000 mg | DELAYED_RELEASE_CAPSULE | Freq: Every day | ORAL | 0 refills | Status: DC

## 2021-04-15 MED ORDER — ESCITALOPRAM OXALATE 10 MG PO TABS: 10.0000 mg | ORAL_TABLET | Freq: Every day | ORAL | 0 refills | Status: DC

## 2021-04-15 MED ORDER — AMLODIPINE BESYLATE 5 MG PO TABS: 5.0000 mg | ORAL_TABLET | Freq: Every day | ORAL | 0 refills | Status: DC

## 2021-04-15 NOTE — Telephone Encounter (Signed)
Patient calling requesting to speak with a nurse in regards to his medication and his pharmacy ph# [phone]

## 2021-04-15 NOTE — Telephone Encounter (Signed)
I spoke with pt and wanted his prescriptions sent to [facility], I have sent all these in.

## 2021-04-17 ENCOUNTER — Other Ambulatory Visit: Payer: Self-pay | Admitting: Internal Medicine

## 2021-04-29 ENCOUNTER — Telehealth: Payer: Self-pay | Admitting: Internal Medicine

## 2021-04-29 NOTE — Telephone Encounter (Signed)
  Left message for patient to call back and schedule Medicare Annual Wellness Visit (AWV) in office.   If unable to come into the office for AWV,  please offer to do virtually or by telephone.  No hx of AWV eligible for AWVI as of 03/24/2017  Please schedule at anytime with [facility]-Nurse Health Advisor.      40 Minutes appointment   Any questions, please call me at [phone]

## 2021-05-01 ENCOUNTER — Ambulatory Visit (HOSPITAL_COMMUNITY): Payer: Medicare Other

## 2021-05-08 ENCOUNTER — Telehealth: Payer: Self-pay | Admitting: Internal Medicine

## 2021-05-08 ENCOUNTER — Other Ambulatory Visit: Payer: Self-pay | Admitting: Internal Medicine

## 2021-05-08 DIAGNOSIS — I10 Essential (primary) hypertension: Secondary | ICD-10-CM

## 2021-05-08 DIAGNOSIS — F418 Other specified anxiety disorders: Secondary | ICD-10-CM

## 2021-05-08 MED ORDER — ESCITALOPRAM OXALATE 10 MG PO TABS: 10.0000 mg | ORAL_TABLET | Freq: Every day | ORAL | 5 refills | Status: DC

## 2021-05-08 NOTE — Telephone Encounter (Signed)
Pt is requesting lexapro, instead of valium

## 2021-05-09 ENCOUNTER — Telehealth: Payer: Self-pay | Admitting: Internal Medicine

## 2021-05-09 NOTE — Telephone Encounter (Signed)
PT LVM that he is returning nurses call

## 2021-05-09 NOTE — Telephone Encounter (Signed)
I spoke with pt and informed him that the Lexapro 10mg  was sent in.

## 2021-05-09 NOTE — Telephone Encounter (Signed)
LVM for pt to call the office.

## 2021-05-09 NOTE — Telephone Encounter (Signed)
Pt informed

## 2021-05-14 ENCOUNTER — Ambulatory Visit: Payer: Medicare Other | Admitting: Cardiology

## 2021-05-15 ENCOUNTER — Ambulatory Visit (INDEPENDENT_AMBULATORY_CARE_PROVIDER_SITE_OTHER): Payer: Medicare Other

## 2021-05-15 ENCOUNTER — Other Ambulatory Visit: Payer: Self-pay

## 2021-05-15 DIAGNOSIS — Z23 Encounter for immunization: Secondary | ICD-10-CM

## 2021-05-22 ENCOUNTER — Telehealth: Payer: Self-pay | Admitting: Internal Medicine

## 2021-05-22 NOTE — Telephone Encounter (Signed)
  No answer unable to leave a message for patient to call back and schedule Medicare Annual Wellness Visit (AWV) in office.   If unable to come into the office for AWV,  please offer to do virtually or by telephone.  No hx of AWV eligible for AWVI as of 03/24/2017  Please schedule at anytime with [facility]-Nurse Health Advisor.      40 Minutes appointment   Any questions, please call me at [phone]

## 2021-06-03 ENCOUNTER — Ambulatory Visit (INDEPENDENT_AMBULATORY_CARE_PROVIDER_SITE_OTHER): Payer: Medicare Other | Admitting: Cardiology

## 2021-06-03 ENCOUNTER — Other Ambulatory Visit: Payer: Self-pay

## 2021-06-03 VITALS — BP 120/80 | HR 64 | Ht 70.0 in | Wt 159.2 lb

## 2021-06-03 DIAGNOSIS — I255 Ischemic cardiomyopathy: Secondary | ICD-10-CM

## 2021-06-03 DIAGNOSIS — I513 Intracardiac thrombosis, not elsewhere classified: Secondary | ICD-10-CM

## 2021-06-03 DIAGNOSIS — E7800 Pure hypercholesterolemia, unspecified: Secondary | ICD-10-CM | POA: Diagnosis not present

## 2021-06-03 DIAGNOSIS — Z720 Tobacco use: Secondary | ICD-10-CM

## 2021-06-03 DIAGNOSIS — I2510 Atherosclerotic heart disease of native coronary artery without angina pectoris: Secondary | ICD-10-CM

## 2021-06-03 DIAGNOSIS — I10 Essential (primary) hypertension: Secondary | ICD-10-CM

## 2021-06-03 MED ORDER — APIXABAN 5 MG PO TABS: 5.0000 mg | ORAL_TABLET | Freq: Two times a day (BID) | ORAL | 1 refills | Status: DC

## 2021-06-03 MED ORDER — LISINOPRIL 40 MG PO TABS: 40.0000 mg | ORAL_TABLET | Freq: Every day | ORAL | 3 refills | Status: DC

## 2021-06-03 MED ORDER — AMLODIPINE BESYLATE 5 MG PO TABS
5.0000 mg | ORAL_TABLET | Freq: Every day | ORAL | 3 refills | Status: DC
Start: 2021-06-03 — End: 2022-06-24

## 2021-06-03 MED ORDER — METOPROLOL SUCCINATE ER 50 MG PO TB24: 50.0000 mg | ORAL_TABLET | Freq: Every day | ORAL | 3 refills | Status: DC

## 2021-06-03 MED ORDER — ROSUVASTATIN CALCIUM 40 MG PO TABS: 40.0000 mg | ORAL_TABLET | Freq: Every day | ORAL | 3 refills | Status: DC

## 2021-06-03 NOTE — Assessment & Plan Note (Signed)
This has been noted previously on echocardiogram.  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  His prior cardiologist stated that he needed to be on Eliquis lifelong.  We will continue.

## 2021-06-03 NOTE — Assessment & Plan Note (Signed)
Continue to encourage cessation. 

## 2021-06-03 NOTE — Assessment & Plan Note (Signed)
Anterior MI 1999 treated with LAD stenting in [place].  He had a Myoview performed 11/13/2010 that showed apical scar and 2D echo revealed EF of 45 to 50% with moderate to severe apical hypokinesia.  He denies chest pain or shortness of breath.

## 2021-06-03 NOTE — Assessment & Plan Note (Signed)
LAD infarct > 20 years ago in 1999 with LAD stenting.  Prior echo performed 01/14/2009 which revealed EF of 45 to 50% with an apical wall motion normality.  Echo in 2019 showed EF of 40 to 45%, relatively unchanged.  Continue with goal-directed medical therapy.

## 2021-06-03 NOTE — Assessment & Plan Note (Signed)
Overall doing well on amlodipine metoprolol and lisinopril.  No changes made.

## 2021-06-03 NOTE — Progress Notes (Signed)
Cardiology Office Note:    Date:  06/03/2021   ID:  James Ortega, DOB 1950-09-05, MRN [id number]  PCP:  Anabel Halon, MD   [facility] Providers Cardiologist:  Donato Schultz, MD     Referring MD: Anabel Halon, MD    History of Present Illness:    James Ortega is a 70 y.o. male here for new patient visit having last been seen over 3 years ago by Dr. Nanetta Batty here to establish.  He is a retired Emergency planning/management officer in prison guard who was in [employer] as well.  He had 50-pack-year tobacco use smokes 1 pack/day treated hypertension as well as hyperlipidemia.  Back in 1999 had an anterior myocardial infarction in [place].  Had an LAD stent placed.  Denies any early family history of coronary artery disease.  Back in 2010 echocardiogram showed EF of 45 to 50% with moderate to severe apical hypokinesia.  Myoview performed on 11/13/2010 showed anterior apical scar without ischemia.  Lost weight over years, teeth lost, dentures.   Had ultrasound done where a LV thrombus was noted - On Eliquis now. Tried to get off prior but back on.  His prior cardiologist stated that he needs to stay on the Eliquis and he is lucky that he has not had a stroke.  No bleeding.   He did asked me if I could prescribe his Klonopin.  I did state that this is something done by his primary care physician or psychiatrist.  Currently denies any fever chills nausea vomiting syncope bleeding.  No chest pain no shortness of breath.  LDL on 04/14/2021 was 78.  Goal would be less than 70.  Normal liver function creatinine 1.28 minimally elevated.  Hemoglobin 14.0.  Past Medical History:  Diagnosis Date   History of acute anterior wall MI    Hyperlipidemia    Hypertension     Past Surgical History:  Procedure Laterality Date   AMPUTATION Left 03/24/2016   Procedure: REVISION AMPUTATION LEFT INDEX FINGER;  Surgeon: Dominica Severin, MD;  Location: [facility] OR;  Service: Orthopedics;  Laterality: Left;   CORONARY  ANGIOPLASTY WITH STENT PLACEMENT  05/1998   bare metal stent [facility] [place]   NM MYOCAR PERF WALL MOTION  11/13/2010   No significant ischemia demonstrated. Low risk scan   US ECHOCARDIOGRAPHY  01/14/2009   Moderate to severe apical wall hypokinesis, trace TR,MR, doppler suggestive of impaired LV relaxation    Current Medications: Current Meds  Medication Sig   escitalopram (LEXAPRO) 10 MG tablet Take 1 tablet (10 mg total) by mouth daily.   omeprazole (PRILOSEC) 20 MG capsule Take 1 capsule (20 mg total) by mouth daily.   rosuvastatin (CRESTOR) 40 MG tablet Take 1 tablet (40 mg total) by mouth daily.   VITAMIN D PO Take 5,000 mg by mouth daily.   vitamin E 400 UNIT capsule Take 800 Units by mouth daily.   [DISCONTINUED] amLODipine (NORVASC) 5 MG tablet Take 1 tablet (5 mg total) by mouth daily.   [DISCONTINUED] apixaban (ELIQUIS) 5 MG TABS tablet Take 1 tablet (5 mg total) by mouth 2 (two) times daily.   [DISCONTINUED] lisinopril (ZESTRIL) 40 MG tablet Take 1 tablet (40 mg total) by mouth daily.   [DISCONTINUED] metoprolol succinate (TOPROL-XL) 50 MG 24 hr tablet Take 1 tablet (50 mg total) by mouth daily. Take with or immediately following a meal.   [DISCONTINUED] rosuvastatin (CRESTOR) 20 MG tablet Take 1 tablet (  20 mg total) by mouth daily.     Allergies:   Patient has no known allergies.   Social History   Socioeconomic History   Marital status: Married    Spouse name: Not on file   Number of children: Not on file   Years of education: Not on file   Highest education level: Not on file  Occupational History   Not on file  Tobacco Use   Smoking status: Every Day    Packs/day: 1.00    Types: Cigarettes   Smokeless tobacco: Never  Substance and Sexual Activity   Alcohol use: No    Alcohol/week: 0.0 standard drinks   Drug use: No   Sexual activity: Yes  Other Topics Concern   Not on file  Social History Narrative   Not on file   Social  Determinants of Health   Financial Resource Strain: Not on file  Food Insecurity: Not on file  Transportation Needs: Not on file  Physical Activity: Not on file  Stress: Not on file  Social Connections: Not on file     Family History: The patient's family history includes Diabetes in his maternal grandfather, maternal grandmother, and mother.  Early family history of coronary artery disease.  ROS:   Please see the history of present illness.     All other systems reviewed and are negative.  EKGs/Labs/Other Studies Reviewed:    The following studies were reviewed today: As above ECHO 2019 - Left ventricle: The cavity size was normal. Systolic function was    mildly to moderately reduced. The estimated ejection fraction was    in the range of 40% to 45%. Aneurysmal deformity of the apical    myocardium; consistent with infarction in the distribution of the    left anterior descending coronary artery. Doppler parameters are    consistent with abnormal left ventricular relaxation (grade 1    diastolic dysfunction). No evidence of thrombus.   EKG:  EKG is  ordered today.  The ekg ordered today demonstrates sinus rhythm 64 left anterior fascicular block old inferior infarct pattern T wave inversion noted in V3 V4 V5, no significant change from prior Prior EKG shows old inferior infarct pattern.  Recent Labs: 04/14/2021: ALT 20; BUN 19; Creatinine, Ser 1.28; Hemoglobin 14.0; Platelets 282; Potassium 3.9; Sodium 146  Recent Lipid Panel    Component Value Date/Time   CHOL 149 04/14/2021 1519   TRIG 162 (H) 04/14/2021 1519   HDL 43 04/14/2021 1519   CHOLHDL 3.5 04/14/2021 1519   LDLCALC 78 04/14/2021 1519     Risk Assessment/Calculations:          Physical Exam:    VS:  BP 120/80   Pulse 64   Ht 5\' 10"  (1.778 m)   Wt 159 lb 3.2 oz (72.2 kg)   SpO2 96%   BMI 22.84 kg/m     Wt Readings from Last 3 Encounters:  06/03/21 159 lb 3.2 oz (72.2 kg)  04/14/21 159 lb (72.1  kg)  05/11/18 168 lb 8 oz (76.4 kg)     GEN: Thin, well developed in no acute distress HEENT: Normal NECK: No JVD; No carotid bruits LYMPHATICS: No lymphadenopathy CARDIAC: RRR, no murmurs, rubs, gallops RESPIRATORY:  Clear to auscultation without rales, wheezing or rhonchi  ABDOMEN: Soft, non-tender, non-distended MUSCULOSKELETAL:  No edema; No deformity  SKIN: Warm and dry NEUROLOGIC:  Alert and oriented x 3 PSYCHIATRIC:  Normal affect   ASSESSMENT:    1. Essential hypertension  2. Coronary artery disease involving native coronary artery of native heart without angina pectoris   3. Ischemic cardiomyopathy   4. Pure hypercholesterolemia   5. Tobacco abuse   6. Left ventricular thrombus    PLAN:    In order of problems listed above:   Coronary artery disease History of CAD status post anterior MI 1999 treated with LAD stenting in [place].  He had a Myoview performed 11/13/2010 that showed apical scar and 2D echo revealed EF of 45 to 50% with moderate to severe apical hypokinesia.  He denies chest pain or shortness of breath.   Ischemic cardiomyopathy History of LAD infarct 20 years ago in 1999 with LAD stenting.  Prior echo performed 01/14/2009 which revealed EF of 45 to 50% with an apical wall motion normality.  We will repeat a 2D echocardiogram.   Essential hypertension History of essential hypertension with blood pressure measured today at 110/80.  He is on amlodipine, metoprolol and lisinopril.   Hyperlipidemia History of hyperlipidemia on statin therapy.     Medication Adjustments/Labs and Tests Ordered: Current medicines are reviewed at length with the patient today.  Concerns regarding medicines are outlined above.  Orders Placed This Encounter  Procedures   Lipid panel   ALT   EKG 12-Lead   ECHOCARDIOGRAM COMPLETE   Meds ordered this encounter  Medications   rosuvastatin (CRESTOR) 40 MG tablet    Sig: Take 1 tablet (40 mg total) by mouth daily.     Dispense:  90 tablet    Refill:  3   amLODipine (NORVASC) 5 MG tablet    Sig: Take 1 tablet (5 mg total) by mouth daily.    Dispense:  90 tablet    Refill:  3   lisinopril (ZESTRIL) 40 MG tablet    Sig: Take 1 tablet (40 mg total) by mouth daily.    Dispense:  90 tablet    Refill:  3   metoprolol succinate (TOPROL-XL) 50 MG 24 hr tablet    Sig: Take 1 tablet (50 mg total) by mouth daily. Take with or immediately following a meal.    Dispense:  90 tablet    Refill:  3   apixaban (ELIQUIS) 5 MG TABS tablet    Sig: Take 1 tablet (5 mg total) by mouth 2 (two) times daily.    Dispense:  180 tablet    Refill:  1     Patient Instructions  Medication Instructions:  Your physician has recommended you make the following change in your medication:   INCREASE the Rosuvastatin to 40 mg taking 1 daily   *If you need a refill on your cardiac medications before your next appointment, please call your pharmacy*   Lab Work: In 3 months:  You will need to come to the lab at [facility], [place], for Lipid & ALT  If you have labs (blood work) drawn today and your tests are completely normal, you will receive your results only by: MyChart Message (if you have MyChart) OR A paper copy in the mail If you have any lab test that is abnormal or we need to change your treatment, we will call you to review the results.   Testing/Procedures: Your physician has requested that you have an echocardiogram. Echocardiography is a painless test that uses sound waves to create images of your heart. It provides your doctor with information about the size and shape of your heart and how well your heart's chambers and valves are working. This  procedure takes approximately one hour. There are no restrictions for this procedure.    Follow-Up: At [facility], you and your health needs are our priority.  As part of our continuing mission to provide you with exceptional heart care, we have created  designated Provider Care Teams.  These Care Teams include your primary Cardiologist (physician) and Advanced Practice Providers (APPs -  Physician Assistants and Nurse Practitioners) who all work together to provide you with the care you need, when you need it.  We recommend signing up for the patient portal called "MyChart".  Sign up information is provided on this After Visit Summary.  MyChart is used to connect with patients for Virtual Visits (Telemedicine).  Patients are able to view lab/test results, encounter notes, upcoming appointments, etc.  Non-urgent messages can be sent to your provider as well.   To learn more about what you can do with MyChart, go to [email or web address].    Your next appointment:   12 month(s)  The format for your next appointment:   In Person  Provider:   You may see Dr. Donato Schultz, or one of the following Advanced Practice Providers on your designated Care Team:   [place], PA-C  Jacolyn Reedy, PA-C    Other Instructions Echocardiogram An echocardiogram is a test that uses sound waves (ultrasound) to produce images of the heart. Images from an echocardiogram can provide important information about: Heart size and shape. The size and thickness and movement of your heart's walls. Heart muscle function and strength. Heart valve function or if you have stenosis. Stenosis is when the heart valves are too narrow. If blood is flowing backward through the heart valves (regurgitation). A tumor or infectious growth around the heart valves. Areas of heart muscle that are not working well because of poor blood flow or injury from a heart attack. Aneurysm detection. An aneurysm is a weak or damaged part of an artery wall. The wall bulges out from the normal force of blood pumping through the body. Tell a health care provider about: Any allergies you have. All medicines you are taking, including vitamins, herbs, eye drops, creams, and over-the-counter  medicines. Any blood disorders you have. Any surgeries you have had. Any medical conditions you have. Whether you are pregnant or may be pregnant. What are the risks? Generally, this is a safe test. However, problems may occur, including an allergic reaction to dye (contrast) that may be used during the test. What happens before the test? No specific preparation is needed. You may eat and drink normally. What happens during the test?  You will take off your clothes from the waist up and put on a hospital gown. Electrodes or electrocardiogram (ECG)patches may be placed on your chest. The electrodes or patches are then connected to a device that monitors your heart rate and rhythm. You will lie down on a table for an ultrasound exam. A gel will be applied to your chest to help sound waves pass through your skin. A handheld device, called a transducer, will be pressed against your chest and moved over your heart. The transducer produces sound waves that travel to your heart and bounce back (or "echo" back) to the transducer. These sound waves will be captured in real-time and changed into images of your heart that can be viewed on a video monitor. The images will be recorded on a computer and reviewed by your health care provider. You may be asked to change positions or hold your breath for  a short time. This makes it easier to get different views or better views of your heart. In some cases, you may receive contrast through an IV in one of your veins. This can improve the quality of the pictures from your heart. The procedure may vary among health care providers and hospitals. What can I expect after the test? You may return to your normal, everyday life, including diet, activities, and medicines, unless your health care provider tells you not to do that. Follow these instructions at home: It is up to you to get the results of your test. Ask your health care provider, or the department that is  doing the test, when your results will be ready. Keep all follow-up visits. This is important. Summary An echocardiogram is a test that uses sound waves (ultrasound) to produce images of the heart. Images from an echocardiogram can provide important information about the size and shape of your heart, heart muscle function, heart valve function, and other possible heart problems. You do not need to do anything to prepare before this test. You may eat and drink normally. After the echocardiogram is completed, you may return to your normal, everyday life, unless your health care provider tells you not to do that. This information is not intended to replace advice given to you by your health care provider. Make sure you discuss any questions you have with your health care provider. Document Revised: 04/02/2020 Document Reviewed: 04/02/2020 Elsevier Patient Education  2022 Elsevier Inc.   Signed, Donato Schultz, MD  06/03/2021 2:43 PM    [facility]

## 2021-06-03 NOTE — Assessment & Plan Note (Addendum)
Increase Crestor to 40 mg once a day.  His last LDL was 78.  Goal is less than 70.  No myalgias.  I will write him a new prescription to [facility].  We will check lipid panel in 3 months with ALT.

## 2021-06-03 NOTE — Patient Instructions (Addendum)
Medication Instructions:  Your physician has recommended you make the following change in your medication:   INCREASE the Rosuvastatin to 40 mg taking 1 daily   *If you need a refill on your cardiac medications before your next appointment, please call your pharmacy*   Lab Work: In 3 months:  You will need to come to the lab at [facility], [place], for Lipid & ALT  If you have labs (blood work) drawn today and your tests are completely normal, you will receive your results only by: MyChart Message (if you have MyChart) OR A paper copy in the mail If you have any lab test that is abnormal or we need to change your treatment, we will call you to review the results.   Testing/Procedures: Your physician has requested that you have an echocardiogram. Echocardiography is a painless test that uses sound waves to create images of your heart. It provides your doctor with information about the size and shape of your heart and how well your heart's chambers and valves are working. This procedure takes approximately one hour. There are no restrictions for this procedure.    Follow-Up: At [facility], you and your health needs are our priority.  As part of our continuing mission to provide you with exceptional heart care, we have created designated Provider Care Teams.  These Care Teams include your primary Cardiologist (physician) and Advanced Practice Providers (APPs -  Physician Assistants and Nurse Practitioners) who all work together to provide you with the care you need, when you need it.  We recommend signing up for the patient portal called "MyChart".  Sign up information is provided on this After Visit Summary.  MyChart is used to connect with patients for Virtual Visits (Telemedicine).  Patients are able to view lab/test results, encounter notes, upcoming appointments, etc.  Non-urgent messages can be sent to your provider as well.   To learn more about what you can do with MyChart,  go to [email or web address].    Your next appointment:   12 month(s)  The format for your next appointment:   In Person  Provider:   You may see Dr. Donato Schultz, or one of the following Advanced Practice Providers on your designated Care Team:   [place], PA-C  Jacolyn Reedy, PA-C    Other Instructions Echocardiogram An echocardiogram is a test that uses sound waves (ultrasound) to produce images of the heart. Images from an echocardiogram can provide important information about: Heart size and shape. The size and thickness and movement of your heart's walls. Heart muscle function and strength. Heart valve function or if you have stenosis. Stenosis is when the heart valves are too narrow. If blood is flowing backward through the heart valves (regurgitation). A tumor or infectious growth around the heart valves. Areas of heart muscle that are not working well because of poor blood flow or injury from a heart attack. Aneurysm detection. An aneurysm is a weak or damaged part of an artery wall. The wall bulges out from the normal force of blood pumping through the body. Tell a health care provider about: Any allergies you have. All medicines you are taking, including vitamins, herbs, eye drops, creams, and over-the-counter medicines. Any blood disorders you have. Any surgeries you have had. Any medical conditions you have. Whether you are pregnant or may be pregnant. What are the risks? Generally, this is a safe test. However, problems may occur, including an allergic reaction to dye (contrast) that may be used during  the test. What happens before the test? No specific preparation is needed. You may eat and drink normally. What happens during the test?  You will take off your clothes from the waist up and put on a hospital gown. Electrodes or electrocardiogram (ECG)patches may be placed on your chest. The electrodes or patches are then connected to a device that monitors  your heart rate and rhythm. You will lie down on a table for an ultrasound exam. A gel will be applied to your chest to help sound waves pass through your skin. A handheld device, called a transducer, will be pressed against your chest and moved over your heart. The transducer produces sound waves that travel to your heart and bounce back (or "echo" back) to the transducer. These sound waves will be captured in real-time and changed into images of your heart that can be viewed on a video monitor. The images will be recorded on a computer and reviewed by your health care provider. You may be asked to change positions or hold your breath for a short time. This makes it easier to get different views or better views of your heart. In some cases, you may receive contrast through an IV in one of your veins. This can improve the quality of the pictures from your heart. The procedure may vary among health care providers and hospitals. What can I expect after the test? You may return to your normal, everyday life, including diet, activities, and medicines, unless your health care provider tells you not to do that. Follow these instructions at home: It is up to you to get the results of your test. Ask your health care provider, or the department that is doing the test, when your results will be ready. Keep all follow-up visits. This is important. Summary An echocardiogram is a test that uses sound waves (ultrasound) to produce images of the heart. Images from an echocardiogram can provide important information about the size and shape of your heart, heart muscle function, heart valve function, and other possible heart problems. You do not need to do anything to prepare before this test. You may eat and drink normally. After the echocardiogram is completed, you may return to your normal, everyday life, unless your health care provider tells you not to do that. This information is not intended to replace advice  given to you by your health care provider. Make sure you discuss any questions you have with your health care provider. Document Revised: 04/02/2020 Document Reviewed: 04/02/2020 Elsevier Patient Education  2022 [employer].

## 2021-06-05 ENCOUNTER — Other Ambulatory Visit: Payer: Self-pay | Admitting: *Deleted

## 2021-06-05 ENCOUNTER — Telehealth: Payer: Self-pay | Admitting: Internal Medicine

## 2021-06-05 DIAGNOSIS — K219 Gastro-esophageal reflux disease without esophagitis: Secondary | ICD-10-CM

## 2021-06-05 MED ORDER — OMEPRAZOLE 20 MG PO CPDR: 20.0000 mg | DELAYED_RELEASE_CAPSULE | Freq: Every day | ORAL | 0 refills | Status: DC

## 2021-06-05 NOTE — Telephone Encounter (Signed)
Pt came in office about Omeprazole refills. Wants them sent through [facility] [address] [place] [address]  Pt states that Cardiologist will be taking over all other prescription but the Omeprazol Does not want valium

## 2021-06-05 NOTE — Telephone Encounter (Signed)
Pt medication sent °

## 2021-06-12 ENCOUNTER — Telehealth: Payer: Self-pay | Admitting: Cardiology

## 2021-06-12 NOTE — Telephone Encounter (Signed)
Checking percert on the following patient for testing scheduled at [facility].    ECHO  07/09/2021

## 2021-07-09 ENCOUNTER — Ambulatory Visit (HOSPITAL_COMMUNITY)
Admission: RE | Admit: 2021-07-09 | Discharge: 2021-07-09 | Disposition: A | Discharge status: Home / Self Care | Payer: Medicare Other | Source: Ambulatory Visit | Attending: Cardiology | Admitting: Cardiology

## 2021-07-09 ENCOUNTER — Other Ambulatory Visit: Payer: Self-pay

## 2021-07-09 ENCOUNTER — Other Ambulatory Visit (HOSPITAL_COMMUNITY)
Admission: RE | Admit: 2021-07-09 | Discharge: 2021-07-09 | Disposition: A | Discharge status: Home / Self Care | Payer: Medicare Other | Source: Ambulatory Visit | Attending: Cardiology | Admitting: Cardiology

## 2021-07-09 DIAGNOSIS — E7800 Pure hypercholesterolemia, unspecified: Secondary | ICD-10-CM | POA: Insufficient documentation

## 2021-07-09 DIAGNOSIS — I2510 Atherosclerotic heart disease of native coronary artery without angina pectoris: Secondary | ICD-10-CM

## 2021-07-09 DIAGNOSIS — I255 Ischemic cardiomyopathy: Secondary | ICD-10-CM | POA: Insufficient documentation

## 2021-07-09 DIAGNOSIS — I10 Essential (primary) hypertension: Secondary | ICD-10-CM | POA: Insufficient documentation

## 2021-07-09 DIAGNOSIS — Z720 Tobacco use: Secondary | ICD-10-CM | POA: Insufficient documentation

## 2021-07-09 LAB — ECHOCARDIOGRAM COMPLETE
Area-P 1/2: 3.53 cm2
S' Lateral: 3.2 cm

## 2021-07-09 LAB — ALT: ALT: 20 U/L (ref 0–44)

## 2021-07-09 LAB — LIPID PANEL
Cholesterol: 149 mg/dL (ref 0–200)
HDL: 40 mg/dL — ABNORMAL LOW (ref >40)
LDL Cholesterol: 81 mg/dL (ref 0–99)
Total CHOL/HDL Ratio: 3.7 RATIO
Triglycerides: 141 mg/dL (ref <150)
VLDL: 28 mg/dL (ref 0–40)

## 2021-07-09 NOTE — Progress Notes (Signed)
*  PRELIMINARY RESULTS* Echocardiogram 2D Echocardiogram has been performed.  Stacey Drain 07/09/2021, 3:57 PM

## 2021-07-11 ENCOUNTER — Other Ambulatory Visit: Payer: Self-pay | Admitting: Internal Medicine

## 2021-07-11 ENCOUNTER — Encounter: Payer: Self-pay | Admitting: Internal Medicine

## 2021-07-11 ENCOUNTER — Telehealth: Payer: Self-pay | Admitting: Cardiology

## 2021-07-11 DIAGNOSIS — E7800 Pure hypercholesterolemia, unspecified: Secondary | ICD-10-CM

## 2021-07-11 DIAGNOSIS — F418 Other specified anxiety disorders: Secondary | ICD-10-CM

## 2021-07-11 MED ORDER — EZETIMIBE 10 MG PO TABS: 10.0000 mg | ORAL_TABLET | Freq: Every day | ORAL | 3 refills | Status: DC

## 2021-07-11 NOTE — Telephone Encounter (Signed)
Patient returned call for Echo results.  ?

## 2021-07-11 NOTE — Telephone Encounter (Signed)
-----   Message from Jake Bathe, MD sent at 07/10/2021  6:56 AM EST ----- We are getting closer to goal LDL less than 70 with LDL 81 on Crestor 40 mg.  Normal liver function. Lets add Zetia 10 mg.  Repeat lipid panel and ALT in 3 months.

## 2021-07-11 NOTE — Telephone Encounter (Signed)
Called pt reviewed results and MD recommendations.  Pt agreeable to plan orders placed.  Pt will have f/u labs drawn at [facility] on 10/13/20.

## 2021-07-16 ENCOUNTER — Telehealth: Payer: Self-pay | Admitting: Cardiology

## 2021-07-16 NOTE — Telephone Encounter (Signed)
Patient returning call about echo results and medication changes. He states he was told to ask for triage.

## 2021-07-16 NOTE — Telephone Encounter (Signed)
Please see result note for details.

## 2021-08-04 ENCOUNTER — Other Ambulatory Visit: Payer: Self-pay

## 2021-08-04 ENCOUNTER — Encounter: Payer: Self-pay | Admitting: Internal Medicine

## 2021-08-04 ENCOUNTER — Ambulatory Visit (INDEPENDENT_AMBULATORY_CARE_PROVIDER_SITE_OTHER): Payer: Medicare Other | Admitting: Internal Medicine

## 2021-08-04 VITALS — BP 111/76 | HR 79 | Resp 17 | Ht 70.0 in | Wt 160.0 lb

## 2021-08-04 DIAGNOSIS — I2510 Atherosclerotic heart disease of native coronary artery without angina pectoris: Secondary | ICD-10-CM

## 2021-08-04 DIAGNOSIS — F418 Other specified anxiety disorders: Secondary | ICD-10-CM | POA: Diagnosis not present

## 2021-08-04 DIAGNOSIS — F17211 Nicotine dependence, cigarettes, in remission: Secondary | ICD-10-CM

## 2021-08-04 DIAGNOSIS — N1831 Chronic kidney disease, stage 3a: Secondary | ICD-10-CM

## 2021-08-04 DIAGNOSIS — K219 Gastro-esophageal reflux disease without esophagitis: Secondary | ICD-10-CM | POA: Diagnosis not present

## 2021-08-04 DIAGNOSIS — I10 Essential (primary) hypertension: Secondary | ICD-10-CM

## 2021-08-04 DIAGNOSIS — N400 Enlarged prostate without lower urinary tract symptoms: Secondary | ICD-10-CM

## 2021-08-04 DIAGNOSIS — Z1211 Encounter for screening for malignant neoplasm of colon: Secondary | ICD-10-CM

## 2021-08-04 DIAGNOSIS — Z125 Encounter for screening for malignant neoplasm of prostate: Secondary | ICD-10-CM

## 2021-08-04 MED ORDER — OMEPRAZOLE 20 MG PO CPDR: 20.0000 mg | DELAYED_RELEASE_CAPSULE | Freq: Every day | ORAL | 3 refills | Status: DC

## 2021-08-04 NOTE — Patient Instructions (Addendum)
Please continue taking medications as prescribed.  Please continue to follow low salt diet and ambulate as tolerated.  Please get fasting blood tests done after 2 weeks.  You are being scheduled for cologuard.

## 2021-08-04 NOTE — Progress Notes (Signed)
Established Patient Office Visit  Subjective:  Patient ID: James Ortega, male    DOB: [birth year]  Age: 70 y.o. MRN: [id number]  CC:  Chief Complaint  Patient presents with   Hypertension   Gastroesophageal Reflux    HPI James Ortega is a 70 y.o. male with past medical history of of CAD s/p stent placement, ischemic cardiomyopathy, HTN, CKD stage 3a, depression with anxiety and tobacco abuse who presents for f/u of his chronic medical conditions.  BP is well-controlled. Takes medications regularly. Patient denies headache, dizziness, chest pain, dyspnea or palpitations.  He was seen by cardiologist for history of CAD.  He had echo, which did not show any LA or LV thrombus.  He is still taking Eliquis, continued by his cardiologist.  Denies any bleeding issues currently.  He has stopped taking clonazepam, Valium and Lexapro.  He has been trying relaxation techniques at home.  He has stopped seeing psychiatry provide that as well.  Denies any SI or HI currently.  He did not get low-dose CT chest done, and wants to think about it for now.  Continues to smoke about 0.5 per day.  Past Medical History:  Diagnosis Date   History of acute anterior wall MI    Hyperlipidemia    Hypertension     Past Surgical History:  Procedure Laterality Date   AMPUTATION Left 03/24/2016   Procedure: REVISION AMPUTATION LEFT INDEX FINGER;  Surgeon: Roseanne Kaufman, MD;  Location: [place];  Service: Orthopedics;  Laterality: Left;   CORONARY ANGIOPLASTY WITH STENT PLACEMENT  05/1998   bare metal stent [facility] [place]   NM MYOCAR PERF WALL MOTION  11/13/2010   No significant ischemia demonstrated. Low risk scan   US ECHOCARDIOGRAPHY  01/14/2009   Moderate to severe apical wall hypokinesis, trace TR,MR, doppler suggestive of impaired LV relaxation    Family History  Problem Relation Age of Onset   Diabetes Mother    Diabetes Maternal Grandmother    Diabetes Maternal  Grandfather     Social History   Socioeconomic History   Marital status: Married    Spouse name: Not on file   Number of children: Not on file   Years of education: Not on file   Highest education level: Not on file  Occupational History   Not on file  Tobacco Use   Smoking status: Every Day    Packs/day: 1.00    Types: Cigarettes   Smokeless tobacco: Never  Substance and Sexual Activity   Alcohol use: No    Alcohol/week: 0.0 standard drinks   Drug use: No   Sexual activity: Yes  Other Topics Concern   Not on file  Social History Narrative   Not on file   Social Determinants of Health   Financial Resource Strain: Not on file  Food Insecurity: Not on file  Transportation Needs: Not on file  Physical Activity: Not on file  Stress: Not on file  Social Connections: Not on file  Intimate Partner Violence: Not on file    Outpatient Medications Prior to Visit  Medication Sig Dispense Refill   amLODipine (NORVASC) 5 MG tablet Take 1 tablet (5 mg total) by mouth daily. 90 tablet 3   apixaban (ELIQUIS) 5 MG TABS tablet Take 1 tablet (5 mg total) by mouth 2 (two) times daily. 180 tablet 1   ezetimibe (ZETIA) 10 MG tablet Take 1 tablet (10 mg total) by mouth daily. 90 tablet 3  lisinopril (ZESTRIL) 40 MG tablet Take 1 tablet (40 mg total) by mouth daily. 90 tablet 3   metoprolol succinate (TOPROL-XL) 50 MG 24 hr tablet Take 1 tablet (50 mg total) by mouth daily. Take with or immediately following a meal. 90 tablet 3   rosuvastatin (CRESTOR) 40 MG tablet Take 1 tablet (40 mg total) by mouth daily. 90 tablet 3   VITAMIN D PO Take 5,000 mg by mouth daily.     vitamin E 400 UNIT capsule Take 800 Units by mouth daily.     escitalopram (LEXAPRO) 10 MG tablet TAKE 1 TABLET DAILY (Patient not taking: Reported on 08/04/2021) 90 tablet 1   omeprazole (PRILOSEC) 20 MG capsule Take 1 capsule (20 mg total) by mouth daily. 90 capsule 0   No facility-administered medications prior to visit.     No Known Allergies  ROS Review of Systems  Constitutional:  Negative for chills and fever.  HENT:  Positive for dental problem. Negative for congestion and sore throat.   Eyes:  Negative for pain and discharge.  Respiratory:  Negative for cough and shortness of breath.   Cardiovascular:  Negative for chest pain and palpitations.  Gastrointestinal:  Negative for constipation, diarrhea, nausea and vomiting.  Endocrine: Negative for polydipsia and polyuria.  Genitourinary:  Negative for dysuria and hematuria.  Musculoskeletal:  Negative for neck pain and neck stiffness.  Skin:  Negative for rash.  Neurological:  Negative for dizziness, weakness, numbness and headaches.  Psychiatric/Behavioral:  Positive for sleep disturbance. Negative for agitation and behavioral problems. The patient is nervous/anxious.      Objective:    Physical Exam Vitals reviewed.  Constitutional:      General: He is not in acute distress.    Appearance: He is not diaphoretic.  HENT:     Head: Normocephalic and atraumatic.     Nose: Nose normal.     Mouth/Throat:     Mouth: Mucous membranes are moist.  Eyes:     General: No scleral icterus.    Extraocular Movements: Extraocular movements intact.  Cardiovascular:     Rate and Rhythm: Normal rate and regular rhythm.     Pulses: Normal pulses.     Heart sounds: Normal heart sounds. No murmur heard. Pulmonary:     Breath sounds: Normal breath sounds. No wheezing or rales.  Musculoskeletal:     Cervical back: Neck supple. No tenderness.     Right lower leg: No edema.     Left lower leg: No edema.  Skin:    General: Skin is warm.     Findings: No rash.  Neurological:     General: No focal deficit present.     Mental Status: He is alert and oriented to person, place, and time.     Sensory: No sensory deficit.     Motor: No weakness.  Psychiatric:        Mood and Affect: Mood normal.        Behavior: Behavior normal.    BP 111/76   Pulse 79    Resp 17   Ht $R'5\' 10"'Og$  (1.778 m)   Wt 160 lb 0.6 oz (72.6 kg)   SpO2 95%   BMI 22.96 kg/m  Wt Readings from Last 3 Encounters:  08/04/21 160 lb 0.6 oz (72.6 kg)  06/03/21 159 lb 3.2 oz (72.2 kg)  04/14/21 159 lb (72.1 kg)    No results found for: TSH Lab Results  Component Value Date   WBC 12.4 (H)  04/14/2021   HGB 14.0 04/14/2021   HCT 41.6 04/14/2021   MCV 95 04/14/2021   PLT 282 04/14/2021   Lab Results  Component Value Date   NA 146 (H) 04/14/2021   K 3.9 04/14/2021   CO2 25 04/14/2021   GLUCOSE 90 04/14/2021   BUN 19 04/14/2021   CREATININE 1.28 (H) 04/14/2021   BILITOT 0.5 04/14/2021   ALKPHOS 62 04/14/2021   AST 24 04/14/2021   ALT 20 07/09/2021   PROT 6.7 04/14/2021   ALBUMIN 4.7 04/14/2021   CALCIUM 10.1 04/14/2021   ANIONGAP 5 03/24/2016   EGFR 61 04/14/2021   Lab Results  Component Value Date   CHOL 149 07/09/2021   Lab Results  Component Value Date   HDL 40 (L) 07/09/2021   Lab Results  Component Value Date   LDLCALC 81 07/09/2021   Lab Results  Component Value Date   TRIG 141 07/09/2021   Lab Results  Component Value Date   CHOLHDL 3.7 07/09/2021   No results found for: HGBA1C    Assessment & Plan:   Problem List Items Addressed This Visit       Cardiovascular and Mediastinum   Coronary artery disease (Chronic)    H/o MI (1999) treated with LAD stenting in [place] On B-blocker On Eliquis for h/o LA thrombus, although last echo was negative for LA or LV thrombus Has seen Cardiology      Essential hypertension - Primary (Chronic)    BP Readings from Last 1 Encounters:  08/04/21 111/76  Well-controlled with Lisinopril, Amlodipine and Metoprolol Counseled for compliance with the medications Advised DASH diet and moderate exercise/walking, at least 150 mins/week      Relevant Orders   Basic Metabolic Panel (BMET)     Genitourinary   Stage 3a chronic kidney disease ([place])    Last BMP reviewed GFR improved now Advised to  maintain adequate hydration Avoid nephrotoxic agents On lisinopril        Other   Depression with anxiety    Has stopped taking clonazepam and Valium now Was started on Lexapro, but he states that he does not need it now Has been trying relaxation techniques at home      Cigarette nicotine dependence in remission    Smokes about 0.5 pack/day Used to smoke 1 pack/day for more than 40 years Wants to think about low-dose CT chest for now  Asked about quitting: confirms that he currently smokes cigarettes Advise to quit smoking: Educated about QUITTING to reduce the risk of cancer, cardio and cerebrovascular disease. Assess willingness: Unwilling to quit at this time, but is working on cutting back. Assist with counseling and pharmacotherapy: Counseled for 5 minutes and literature provided. Arrange for follow up: Follow up in 3 months and continue to offer help.      Other Visit Diagnoses     Gastroesophageal reflux disease without esophagitis       Relevant Medications   omeprazole (PRILOSEC) 20 MG capsule   Prostate cancer screening       Relevant Orders   PSA   Screening for colon cancer       Relevant Orders   Cologuard   Benign prostatic hyperplasia, unspecified whether lower urinary tract symptoms present       Relevant Orders   PSA       Meds ordered this encounter  Medications   omeprazole (PRILOSEC) 20 MG capsule    Sig: Take 1 capsule (20 mg total) by mouth daily.  Dispense:  90 capsule    Refill:  3    Follow-up: Return in about 6 months (around 02/02/2022) for HTN, CAD.    Lindell Spar, MD

## 2021-08-08 NOTE — Assessment & Plan Note (Signed)
Last BMP reviewed GFR improved now Advised to maintain adequate hydration Avoid nephrotoxic agents On lisinopril

## 2021-08-08 NOTE — Assessment & Plan Note (Signed)
BP Readings from Last 1 Encounters:  08/04/21 111/76   Well-controlled with Lisinopril, Amlodipine and Metoprolol Counseled for compliance with the medications Advised DASH diet and moderate exercise/walking, at least 150 mins/week

## 2021-08-08 NOTE — Assessment & Plan Note (Signed)
Has stopped taking clonazepam and Valium now Was started on Lexapro, but he states that he does not need it now Has been trying relaxation techniques at home

## 2021-08-08 NOTE — Assessment & Plan Note (Signed)
Smokes about 0.5 pack/day Used to smoke 1 pack/day for more than 40 years Wants to think about low-dose CT chest for now  Asked about quitting: confirms that he currently smokes cigarettes Advise to quit smoking: Educated about QUITTING to reduce the risk of cancer, cardio and cerebrovascular disease. Assess willingness: Unwilling to quit at this time, but is working on cutting back. Assist with counseling and pharmacotherapy: Counseled for 5 minutes and literature provided. Arrange for follow up: Follow up in 3 months and continue to offer help. 

## 2021-08-08 NOTE — Assessment & Plan Note (Signed)
H/o MI (1999) treated with LAD stenting in [place] On B-blocker On Eliquis for h/o LA thrombus, although last echo was negative for LA or LV thrombus Has seen Cardiology 

## 2021-08-14 ENCOUNTER — Ambulatory Visit: Payer: Medicare Other | Admitting: Internal Medicine

## 2021-08-23 LAB — BASIC METABOLIC PANEL
BUN/Creatinine Ratio: 13 (ref 10–24)
BUN: 17 mg/dL (ref 8–27)
CO2: 24 mmol/L (ref 20–29)
Calcium: 9.7 mg/dL (ref 8.6–10.2)
Chloride: 105 mmol/L (ref 96–106)
Creatinine, Ser: 1.32 mg/dL — ABNORMAL HIGH (ref 0.76–1.27)
Glucose: 140 mg/dL — ABNORMAL HIGH (ref 70–99)
Potassium: 3.9 mmol/L (ref 3.5–5.2)
Sodium: 145 mmol/L — ABNORMAL HIGH (ref 134–144)
eGFR: 58 mL/min/{1.73_m2} — ABNORMAL LOW (ref >59)

## 2021-08-23 LAB — PSA: Prostate Specific Ag, Serum: 2.3 ng/mL (ref 0.0–4.0)

## 2021-08-25 ENCOUNTER — Telehealth: Payer: Self-pay | Admitting: Internal Medicine

## 2021-08-25 ENCOUNTER — Telehealth: Payer: Self-pay

## 2021-08-25 NOTE — Telephone Encounter (Signed)
Called pt to advise , NA LVM

## 2021-08-25 NOTE — Telephone Encounter (Signed)
Patient called and has questions about his blood test and also want to talk with you about a Prostate Test.

## 2021-08-25 NOTE — Telephone Encounter (Signed)
Pt called in for CT reorder  Pt would like pm

## 2021-08-25 NOTE — Telephone Encounter (Signed)
Pt can call [place] [facility] and reschedule as he cancelled the last appt [id number]

## 2021-08-28 ENCOUNTER — Telehealth: Payer: Self-pay | Admitting: Internal Medicine

## 2021-08-28 ENCOUNTER — Encounter: Payer: Self-pay | Admitting: *Deleted

## 2021-08-28 NOTE — Telephone Encounter (Signed)
Pt called to return phone call  Pt sates that he has seen results   And if there is anything of concern that he missed can call and discuss

## 2021-08-28 NOTE — Telephone Encounter (Signed)
noted 

## 2021-09-06 LAB — COLOGUARD: COLOGUARD: NEGATIVE

## 2021-09-26 ENCOUNTER — Ambulatory Visit (HOSPITAL_COMMUNITY): Payer: Medicare Other

## 2021-09-26 ENCOUNTER — Encounter (HOSPITAL_COMMUNITY): Payer: Self-pay

## 2021-10-06 ENCOUNTER — Other Ambulatory Visit: Payer: Self-pay

## 2021-10-06 ENCOUNTER — Telehealth: Payer: Medicare Other | Admitting: Internal Medicine

## 2021-10-06 ENCOUNTER — Ambulatory Visit: Payer: Medicare Other | Admitting: Internal Medicine

## 2021-10-07 ENCOUNTER — Encounter: Payer: Self-pay | Admitting: Nurse Practitioner

## 2021-10-07 ENCOUNTER — Ambulatory Visit (INDEPENDENT_AMBULATORY_CARE_PROVIDER_SITE_OTHER): Payer: Medicare Other | Admitting: Nurse Practitioner

## 2021-10-07 ENCOUNTER — Other Ambulatory Visit: Payer: Self-pay

## 2021-10-07 DIAGNOSIS — T7840XA Allergy, unspecified, initial encounter: Secondary | ICD-10-CM

## 2021-10-07 DIAGNOSIS — J029 Acute pharyngitis, unspecified: Secondary | ICD-10-CM

## 2021-10-07 MED ORDER — CETIRIZINE HCL 10 MG PO TABS: 10.0000 mg | ORAL_TABLET | Freq: Every day | ORAL | 11 refills | Status: DC

## 2021-10-07 MED ORDER — FLUTICASONE PROPIONATE 50 MCG/ACT NA SUSP: 2.0000 | Freq: Every day | NASAL | 6 refills | Status: DC

## 2021-10-07 NOTE — Progress Notes (Signed)
Virtual Visit via Telephone Note  I connected with James Ortega on 10/07/21 at 4:50pm by telephone and verified that I am speaking with the correct person using two identifiers. I spent 7 minutes talking to the pt and reviewing his chart.   Location: Patient: home Provider: office   I discussed the limitations, risks, security and privacy concerns of performing an evaluation and management service by telephone and the availability of in person appointments. I also discussed with the patient that there may be a patient responsible charge related to this service. The patient expressed understanding and agreed to proceed.   History of Present Illness: Pt c/o sore throat, stuffy nose for about a week. He has use some amoxicillin that he had at home from 6 months ago and he feels better. He could swallow better today.  Pt is a current smoker,started smoking at age age 40.  Denies sneezing, fever, cough, SOB, wheezing, sinus pressure, body aches. Pt stated that he has seasonal allergies , uses Flonase as needed.    Observations/Objective:   Assessment and Plan: Allergies. RX zyrtec 10mg  daily, Flonase nasal spray. Need to quit smoking discussed with pt he verbalized understanding.  Sore throat . Pt will come to the office tomorrow for strp throat test.                 Take tylenol as needed for pain, drink warm fluids.   Follow Up Instructions:    I discussed the assessment and treatment plan with the patient. The patient was provided an opportunity to ask questions and all were answered. The patient agreed with the plan and demonstrated an understanding of the instructions.   The patient was advised to call back or seek an in-person evaluation if the symptoms worsen or if the condition fails to improve as anticipated.

## 2021-10-07 NOTE — Assessment & Plan Note (Signed)
RX zytrec 10mg  daily Flonase nasal spray daily Quit smoking.

## 2021-10-07 NOTE — Assessment & Plan Note (Signed)
Pt will come to the office tomorrow for strep throat test.                 Take tylenol as needed for pain, drink warm fluids.

## 2021-10-08 ENCOUNTER — Ambulatory Visit: Payer: TRICARE For Life (TFL)

## 2021-10-08 ENCOUNTER — Telehealth: Payer: Self-pay | Admitting: Internal Medicine

## 2021-10-09 ENCOUNTER — Other Ambulatory Visit: Payer: Self-pay

## 2021-10-09 ENCOUNTER — Ambulatory Visit: Payer: Medicare Other

## 2021-10-09 LAB — POCT RAPID STREP A (OFFICE): Rapid Strep A Screen: NEGATIVE

## 2021-10-09 NOTE — Addendum Note (Signed)
Addended by: Jasper Riling on: 10/09/2021 03:38 PM   Modules accepted: Orders

## 2021-10-09 NOTE — Progress Notes (Signed)
Strep negative please discussed results with patient, patient should let us know if his symptoms has not improved when you call him

## 2021-10-10 ENCOUNTER — Ambulatory Visit: Payer: TRICARE For Life (TFL) | Admitting: Internal Medicine

## 2021-10-31 ENCOUNTER — Ambulatory Visit (HOSPITAL_COMMUNITY): Admission: RE | Admit: 2021-10-31 | Payer: TRICARE For Life (TFL) | Source: Ambulatory Visit

## 2021-12-04 ENCOUNTER — Other Ambulatory Visit: Payer: Self-pay | Admitting: Cardiology

## 2021-12-04 DIAGNOSIS — I255 Ischemic cardiomyopathy: Secondary | ICD-10-CM

## 2021-12-04 NOTE — Telephone Encounter (Signed)
Age 71, weight 72kg, SCr 1.32 12/22, LV thrombus indication, last OV 10/22, refill sent in ?

## 2021-12-18 ENCOUNTER — Telehealth: Payer: Self-pay | Admitting: Cardiology

## 2021-12-18 NOTE — Telephone Encounter (Signed)
Patient called and wanted to know if Dr. Anne Fu would be able to put in an order for him to get an echocardiogram done. Says that he gets one every year to make sure that his stint is still in good shape and functioning properly ?

## 2021-12-18 NOTE — Telephone Encounter (Signed)
Left message for pt to call back to discuss reason he is requesting echo.  Pt had echo 06/2021. ? ? Jake Bathe, MD  ?07/10/2021  6:48 AM EST   ?  ?Pump function mildly reduced with apical infarct.  No new changes.  Thankfully no obvious apical mural thrombus noted.   ? ?

## 2021-12-23 NOTE — Telephone Encounter (Signed)
Left message to return call 

## 2021-12-24 NOTE — Telephone Encounter (Signed)
Left another message asking pt to call office back to clarify why he wants another ECHO done (last 11/22 and recommendation was for next one in 1 year). ?

## 2021-12-24 NOTE — Telephone Encounter (Signed)
Sent MyChart message. ? ?3 attempts to contact Pt. ? ?Will await further needs. ?

## 2022-01-26 ENCOUNTER — Encounter: Payer: Self-pay | Admitting: Internal Medicine

## 2022-01-26 ENCOUNTER — Ambulatory Visit (INDEPENDENT_AMBULATORY_CARE_PROVIDER_SITE_OTHER): Payer: Medicare Other | Admitting: Internal Medicine

## 2022-01-26 VITALS — BP 114/80 | HR 73 | Ht 70.0 in | Wt 164.0 lb

## 2022-01-26 DIAGNOSIS — E782 Mixed hyperlipidemia: Secondary | ICD-10-CM

## 2022-01-26 DIAGNOSIS — F17210 Nicotine dependence, cigarettes, uncomplicated: Secondary | ICD-10-CM

## 2022-01-26 DIAGNOSIS — N1831 Chronic kidney disease, stage 3a: Secondary | ICD-10-CM | POA: Diagnosis not present

## 2022-01-26 DIAGNOSIS — I255 Ischemic cardiomyopathy: Secondary | ICD-10-CM

## 2022-01-26 DIAGNOSIS — I10 Essential (primary) hypertension: Secondary | ICD-10-CM

## 2022-01-26 DIAGNOSIS — E785 Hyperlipidemia, unspecified: Secondary | ICD-10-CM | POA: Diagnosis not present

## 2022-01-26 DIAGNOSIS — I129 Hypertensive chronic kidney disease with stage 1 through stage 4 chronic kidney disease, or unspecified chronic kidney disease: Secondary | ICD-10-CM | POA: Diagnosis not present

## 2022-01-26 DIAGNOSIS — Z720 Tobacco use: Secondary | ICD-10-CM

## 2022-01-26 NOTE — Assessment & Plan Note (Signed)
On Crestor 

## 2022-01-26 NOTE — Assessment & Plan Note (Signed)
Echo reviewed He states that he had LA thrombus, but Echo did not show it. His previous PCP mentioned about it. No known h/o paroxysmal A Fib. Followed by cardiology On ACEi and statin Appears euvolemic currently

## 2022-01-26 NOTE — Assessment & Plan Note (Signed)
BP Readings from Last 1 Encounters:  01/26/22 114/80   Well-controlled with Lisinopril, Amlodipine and Metoprolol Counseled for compliance with the medications Advised DASH diet and moderate exercise/walking, at least 150 mins/week

## 2022-01-26 NOTE — Patient Instructions (Addendum)
Please continue to take medications as prescribed.  Please continue to cut down -> quit smoking.  Please consider getting Shingrix vaccine at your local pharmacy.

## 2022-01-26 NOTE — Assessment & Plan Note (Signed)
Smokes about 0.5 pack/day Used to smoke 1 pack/day for more than 40 years  Asked about quitting: confirms that he currently smokes cigarettes Advise to quit smoking: Educated about QUITTING to reduce the risk of cancer, cardio and cerebrovascular disease. Assess willingness: Unwilling to quit at this time, but is working on cutting back. Assist with counseling and pharmacotherapy: Counseled for 5 minutes and literature provided. Arrange for follow up: Follow up in 3 months and continue to offer help. 

## 2022-01-26 NOTE — Progress Notes (Signed)
Established Patient Office Visit  Subjective:  Patient ID: James Ortega, male    DOB: [birth year]  Age: 71 y.o. MRN: [id number]  CC:  Chief Complaint  Patient presents with   Hypertension    6 month f/u     HPI James Ortega is a 71 y.o. male with past medical history of CAD s/p stent placement, ischemic cardiomyopathy, HTN, CKD stage 3a, depression with anxiety and tobacco abuse who presents for f/u of his chronic medical conditions.  BP is well-controlled. Takes medications regularly. Patient denies headache, dizziness, chest pain, dyspnea or palpitations.   He was seen by cardiologist for history of CAD.  He had echo, which did not show any LA or LV thrombus.  He is still taking Eliquis, continued by his cardiologist.  Denies any bleeding issues currently.  He continues to smoke about 0.5 per day.   Past Medical History:  Diagnosis Date   History of acute anterior wall MI    Hyperlipidemia    Hypertension     Past Surgical History:  Procedure Laterality Date   AMPUTATION Left 03/24/2016   Procedure: REVISION AMPUTATION LEFT INDEX FINGER;  Surgeon: Roseanne Kaufman, MD;  Location: [facility];  Service: Orthopedics;  Laterality: Left;   CORONARY ANGIOPLASTY WITH STENT PLACEMENT  05/1998   bare metal stent [facility] [place]   NM MYOCAR PERF WALL MOTION  11/13/2010   No significant ischemia demonstrated. Low risk scan   US ECHOCARDIOGRAPHY  01/14/2009   Moderate to severe apical wall hypokinesis, trace TR,MR, doppler suggestive of impaired LV relaxation    Family History  Problem Relation Age of Onset   Diabetes Mother    Diabetes Maternal Grandmother    Diabetes Maternal Grandfather     Social History   Socioeconomic History   Marital status: Married    Spouse name: Not on file   Number of children: Not on file   Years of education: Not on file   Highest education level: Not on file  Occupational History   Not on file  Tobacco Use   Smoking  status: Every Day    Packs/day: 1.00    Types: Cigarettes   Smokeless tobacco: Never  Substance and Sexual Activity   Alcohol use: No    Alcohol/week: 0.0 standard drinks   Drug use: No   Sexual activity: Yes  Other Topics Concern   Not on file  Social History Narrative   Not on file   Social Determinants of Health   Financial Resource Strain: Not on file  Food Insecurity: Not on file  Transportation Needs: Not on file  Physical Activity: Not on file  Stress: Not on file  Social Connections: Not on file  Intimate Partner Violence: Not on file    Outpatient Medications Prior to Visit  Medication Sig Dispense Refill   amLODipine (NORVASC) 5 MG tablet Take 1 tablet (5 mg total) by mouth daily. 90 tablet 3   apixaban (ELIQUIS) 5 MG TABS tablet TAKE 1 TABLET TWICE A DAY 180 tablet 1   ezetimibe (ZETIA) 10 MG tablet Take 1 tablet (10 mg total) by mouth daily. 90 tablet 3   fluticasone (FLONASE) 50 MCG/ACT nasal spray Place 2 sprays into both nostrils daily. 16 g 6   lisinopril (ZESTRIL) 40 MG tablet Take 1 tablet (40 mg total) by mouth daily. 90 tablet 3   metoprolol succinate (TOPROL-XL) 50 MG 24 hr tablet Take 1 tablet (50 mg total) by mouth  daily. Take with or immediately following a meal. 90 tablet 3   omeprazole (PRILOSEC) 20 MG capsule Take 1 capsule (20 mg total) by mouth daily. 90 capsule 3   VITAMIN D PO Take 5,000 mg by mouth daily.     vitamin E 400 UNIT capsule Take 800 Units by mouth daily.     cetirizine (ZYRTEC) 10 MG tablet Take 1 tablet (10 mg total) by mouth daily. (Patient not taking: Reported on 01/26/2022) 30 tablet 11   rosuvastatin (CRESTOR) 40 MG tablet Take 1 tablet (40 mg total) by mouth daily. 90 tablet 3   No facility-administered medications prior to visit.    No Known Allergies  ROS Review of Systems  Constitutional:  Negative for chills and fever.  HENT:  Negative for congestion and sore throat.   Eyes:  Negative for pain and discharge.   Respiratory:  Negative for cough and shortness of breath.   Cardiovascular:  Negative for chest pain and palpitations.  Gastrointestinal:  Negative for diarrhea, nausea and vomiting.  Endocrine: Negative for polydipsia and polyuria.  Genitourinary:  Negative for dysuria and hematuria.  Musculoskeletal:  Negative for neck pain and neck stiffness.  Skin:  Negative for rash.  Neurological:  Negative for dizziness, weakness, numbness and headaches.  Psychiatric/Behavioral:  Positive for sleep disturbance. Negative for agitation and behavioral problems.      Objective:    Physical Exam Vitals reviewed.  Constitutional:      General: He is not in acute distress.    Appearance: He is not diaphoretic.  HENT:     Head: Normocephalic and atraumatic.     Nose: Nose normal.     Mouth/Throat:     Mouth: Mucous membranes are moist.  Eyes:     General: No scleral icterus.    Extraocular Movements: Extraocular movements intact.  Cardiovascular:     Rate and Rhythm: Normal rate and regular rhythm.     Pulses: Normal pulses.     Heart sounds: Normal heart sounds. No murmur heard. Pulmonary:     Breath sounds: Normal breath sounds. No wheezing or rales.  Musculoskeletal:     Cervical back: Neck supple. No tenderness.     Right lower leg: No edema.     Left lower leg: No edema.  Skin:    General: Skin is warm.     Findings: No rash.  Neurological:     General: No focal deficit present.     Mental Status: He is alert and oriented to person, place, and time.     Sensory: No sensory deficit.     Motor: No weakness.  Psychiatric:        Mood and Affect: Mood normal.        Behavior: Behavior normal.    BP 114/80 (BP Location: Right Arm, Patient Position: Sitting, Cuff Size: Normal)   Pulse 73   Ht _0  (1.778 m)   Wt 164 lb (74.4 kg)   SpO2 96%   BMI 23.53 kg/m  Wt Readings from Last 3 Encounters:  01/26/22 164 lb (74.4 kg)  08/04/21 160 lb 0.6 oz (72.6 kg)  06/03/21 159 lb 3.2  oz (72.2 kg)    No results found for: TSH Lab Results  Component Value Date   WBC 12.4 (H) 04/14/2021   HGB 14.0 04/14/2021   HCT 41.6 04/14/2021   MCV 95 04/14/2021   PLT 282 04/14/2021   Lab Results  Component Value Date   NA 145 (H) 08/21/2021  K 3.9 08/21/2021   CO2 24 08/21/2021   GLUCOSE 140 (H) 08/21/2021   BUN 17 08/21/2021   CREATININE 1.32 (H) 08/21/2021   BILITOT 0.5 04/14/2021   ALKPHOS 62 04/14/2021   AST 24 04/14/2021   ALT 20 07/09/2021   PROT 6.7 04/14/2021   ALBUMIN 4.7 04/14/2021   CALCIUM 9.7 08/21/2021   ANIONGAP 5 03/24/2016   EGFR 58 (L) 08/21/2021   Lab Results  Component Value Date   CHOL 149 07/09/2021   Lab Results  Component Value Date   HDL 40 (L) 07/09/2021   Lab Results  Component Value Date   LDLCALC 81 07/09/2021   Lab Results  Component Value Date   TRIG 141 07/09/2021   Lab Results  Component Value Date   CHOLHDL 3.7 07/09/2021   No results found for: HGBA1C    Assessment & Plan:   Problem List Items Addressed This Visit       Cardiovascular and Mediastinum   Ischemic cardiomyopathy - Primary (Chronic)    Echo reviewed He states that he had LA thrombus, but Echo did not show it. His previous PCP mentioned about it. No known h/o paroxysmal A Fib. Followed by cardiology On ACEi and statin Appears euvolemic currently       Essential hypertension (Chronic)    BP Readings from Last 1 Encounters:  01/26/22 114/80  Well-controlled with Lisinopril, Amlodipine and Metoprolol Counseled for compliance with the medications Advised DASH diet and moderate exercise/walking, at least 150 mins/week        Genitourinary   Stage 3a chronic kidney disease ([place])    Last BMP reviewed Advised to maintain adequate hydration Avoid nephrotoxic agents On lisinopril       Relevant Orders   Urinalysis   Basic Metabolic Panel (BMET)   Urine Microalbumin w/creat. ratio     Other   Hyperlipidemia (Chronic)    On  Crestor       Relevant Orders   Lipid Profile   Tobacco abuse    Smokes about 0.5 pack/day Used to smoke 1 pack/day for more than 40 years  Asked about quitting: confirms that he currently smokes cigarettes Advise to quit smoking: Educated about QUITTING to reduce the risk of cancer, cardio and cerebrovascular disease. Assess willingness: Unwilling to quit at this time, but is working on cutting back. Assist with counseling and pharmacotherapy: Counseled for 5 minutes and literature provided. Arrange for follow up: Follow up in 3 months and continue to offer help.       No orders of the defined types were placed in this encounter.   Follow-up: Return in about 6 months (around 07/28/2022).    Lindell Spar, MD

## 2022-01-26 NOTE — Assessment & Plan Note (Signed)
Last BMP reviewed Advised to maintain adequate hydration Avoid nephrotoxic agents On lisinopril

## 2022-02-02 ENCOUNTER — Ambulatory Visit: Payer: Medicare Other | Admitting: Internal Medicine

## 2022-02-12 LAB — MICROALBUMIN / CREATININE URINE RATIO
Creatinine, Urine: 167.4 mg/dL
Microalb/Creat Ratio: 22 mg/g creat (ref 0–29)
Microalbumin, Urine: 36.2 ug/mL

## 2022-02-12 LAB — URINALYSIS
Bilirubin, UA: NEGATIVE
Glucose, UA: NEGATIVE
Ketones, UA: NEGATIVE
Nitrite, UA: NEGATIVE
RBC, UA: NEGATIVE
Specific Gravity, UA: 1.02 (ref 1.005–1.030)
Urobilinogen, Ur: 0.2 mg/dL (ref 0.2–1.0)
pH, UA: 6 (ref 5.0–7.5)

## 2022-02-12 LAB — BASIC METABOLIC PANEL
BUN/Creatinine Ratio: 11 (ref 10–24)
BUN: 15 mg/dL (ref 8–27)
CO2: 26 mmol/L (ref 20–29)
Calcium: 9.5 mg/dL (ref 8.6–10.2)
Chloride: 105 mmol/L (ref 96–106)
Creatinine, Ser: 1.37 mg/dL — ABNORMAL HIGH (ref 0.76–1.27)
Glucose: 103 mg/dL — ABNORMAL HIGH (ref 70–99)
Potassium: 3.8 mmol/L (ref 3.5–5.2)
Sodium: 142 mmol/L (ref 134–144)
eGFR: 55 mL/min/{1.73_m2} — ABNORMAL LOW (ref >59)

## 2022-02-12 NOTE — Progress Notes (Signed)
LVM for pt to call the office.

## 2022-03-10 ENCOUNTER — Encounter: Payer: Self-pay | Admitting: Cardiology

## 2022-05-11 ENCOUNTER — Other Ambulatory Visit: Payer: Self-pay | Admitting: Cardiology

## 2022-05-29 ENCOUNTER — Other Ambulatory Visit: Payer: Self-pay | Admitting: Cardiology

## 2022-05-29 DIAGNOSIS — I10 Essential (primary) hypertension: Secondary | ICD-10-CM

## 2022-06-02 ENCOUNTER — Ambulatory Visit: Payer: Medicare Other | Attending: Cardiology | Admitting: Cardiology

## 2022-06-02 ENCOUNTER — Telehealth: Payer: Self-pay | Admitting: Internal Medicine

## 2022-06-02 ENCOUNTER — Encounter: Payer: Self-pay | Admitting: Cardiology

## 2022-06-02 ENCOUNTER — Other Ambulatory Visit: Payer: Self-pay | Admitting: *Deleted

## 2022-06-02 ENCOUNTER — Other Ambulatory Visit: Payer: Self-pay | Admitting: Cardiology

## 2022-06-02 ENCOUNTER — Ambulatory Visit (INDEPENDENT_AMBULATORY_CARE_PROVIDER_SITE_OTHER): Payer: Medicare Other

## 2022-06-02 VITALS — BP 102/60 | HR 72 | Ht 70.0 in | Wt 165.8 lb

## 2022-06-02 DIAGNOSIS — R0602 Shortness of breath: Secondary | ICD-10-CM | POA: Diagnosis present

## 2022-06-02 DIAGNOSIS — I255 Ischemic cardiomyopathy: Secondary | ICD-10-CM

## 2022-06-02 DIAGNOSIS — I10 Essential (primary) hypertension: Secondary | ICD-10-CM | POA: Insufficient documentation

## 2022-06-02 DIAGNOSIS — Z23 Encounter for immunization: Secondary | ICD-10-CM | POA: Diagnosis not present

## 2022-06-02 DIAGNOSIS — I2510 Atherosclerotic heart disease of native coronary artery without angina pectoris: Secondary | ICD-10-CM | POA: Insufficient documentation

## 2022-06-02 DIAGNOSIS — K219 Gastro-esophageal reflux disease without esophagitis: Secondary | ICD-10-CM

## 2022-06-02 MED ORDER — OMEPRAZOLE 20 MG PO CPDR: 20.0000 mg | DELAYED_RELEASE_CAPSULE | Freq: Every day | ORAL | 3 refills | Status: DC

## 2022-06-02 NOTE — Progress Notes (Signed)
G

## 2022-06-02 NOTE — Telephone Encounter (Signed)
Sent to pharmacy 

## 2022-06-02 NOTE — Patient Instructions (Signed)
Medication Instructions:  ?Your physician recommends that you continue on your current medications as directed. Please refer to the Current Medication list given to you today. ? ? ?Labwork: ?None today ? ?Testing/Procedures: ?Your physician has requested that you have an echocardiogram. Echocardiography is a painless test that uses sound waves to create images of your heart. It provides your doctor with information about the size and shape of your heart and how well your heart?s chambers and valves are working. This procedure takes approximately one hour. There are no restrictions for this procedure. ? ? ?Follow-Up: ?1 year ? ?Any Other Special Instructions Will Be Listed Below (If Applicable). ? ?If you need a refill on your cardiac medications before your next appointment, please call your pharmacy. ? ?

## 2022-06-02 NOTE — Telephone Encounter (Signed)
Prescription refill request for Eliquis received. Indication:LV Thrombus Last office visit:10/22 Scr:1.3 Age: 71 Weight:74.4 kg  Prescription refilled

## 2022-06-02 NOTE — Progress Notes (Signed)
Cardiology Office Note:    Date:  06/02/2022   ID:  James Ortega, DOB 04/30/1951, MRN [id number]  PCP:  Lindell Spar, MD   [facility] Providers Cardiologist:  Candee Furbish, MD     Referring MD: Lindell Spar, MD    History of Present Illness:    James Ortega is a 71 y.o. male here for follow-up hyperlipidemia.  LDL was 81 on Crestor 40 mg.  We added Zetia 10 mg.  This was back in November 2022.  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  50-pack-year tobacco use smokes 1 pack/day.  Back in 1999 had anterior myocardial infarction in [place].  LAD stent.  No early family history of CAD.  In 2010 echocardiogram was 45 to 50% EF with moderate to severe apical hypokinesia.  Myoview in 2012 showed anterior apical scar without ischemia.  Prior cardiologist stated that he needs to stay on the Eliquis and he is lucky that he has not had a stroke.  Previously had a ultrasound done where LV thrombus was noted.  He does his rotations and stretches in the morning.  Bowflex, pulleys on the porch.  Has been feeling some shortness of breath recently.  No chest pain.  States that he has lost about 15 pounds.  At times he may feel some slight dizziness when getting up.  Past Medical History:  Diagnosis Date   History of acute anterior wall MI    Hyperlipidemia    Hypertension     Past Surgical History:  Procedure Laterality Date   AMPUTATION Left 03/24/2016   Procedure: REVISION AMPUTATION LEFT INDEX FINGER;  Surgeon: Roseanne Kaufman, MD;  Location: [facility];  Service: Orthopedics;  Laterality: Left;   CORONARY ANGIOPLASTY WITH STENT PLACEMENT  05/1998   bare metal stent [facility] [place]   NM MYOCAR PERF WALL MOTION  11/13/2010   No significant ischemia demonstrated. Low risk scan   US ECHOCARDIOGRAPHY  01/14/2009   Moderate to severe apical wall hypokinesis, trace TR,MR, doppler suggestive of impaired LV relaxation     Current Medications: Current Meds  Medication Sig   amLODipine (NORVASC) 5 MG tablet Take 1 tablet (5 mg total) by mouth daily.   cetirizine (ZYRTEC) 10 MG tablet Take 1 tablet (10 mg total) by mouth daily.   ELIQUIS 5 MG TABS tablet TAKE 1 TABLET TWICE A DAY   ezetimibe (ZETIA) 10 MG tablet Take 1 tablet (10 mg total) by mouth daily.   fluticasone (FLONASE) 50 MCG/ACT nasal spray Place 2 sprays into both nostrils daily.   lisinopril (ZESTRIL) 40 MG tablet Take 1 tablet (40 mg total) by mouth daily.   metoprolol succinate (TOPROL-XL) 50 MG 24 hr tablet TAKE 1 TABLET DAILY WITH OR IMMEDIATELY FOLLOWING A MEAL   omeprazole (PRILOSEC) 20 MG capsule Take 1 capsule (20 mg total) by mouth daily.   rosuvastatin (CRESTOR) 40 MG tablet TAKE 1 TABLET DAILY   VITAMIN D PO Take 5,000 mg by mouth daily.   vitamin E 400 UNIT capsule Take 800 Units by mouth daily.     Allergies:   Patient has no known allergies.   Social History   Socioeconomic History   Marital status: Married    Spouse name: Not on file   Number of children: Not on file   Years of education: Not on file   Highest education level: Not on file  Occupational  History   Not on file  Tobacco Use   Smoking status: Every Day    Packs/day: 1.00    Types: Cigarettes   Smokeless tobacco: Never  Vaping Use   Vaping Use: Never used  Substance and Sexual Activity   Alcohol use: No    Alcohol/week: 0.0 standard drinks of alcohol   Drug use: No   Sexual activity: Yes  Other Topics Concern   Not on file  Social History Narrative   Not on file   Social Determinants of Health   Financial Resource Strain: Not on file  Food Insecurity: Not on file  Transportation Needs: Not on file  Physical Activity: Not on file  Stress: Not on file  Social Connections: Not on file     Family History: The patient's family history includes Diabetes in his maternal grandfather, maternal grandmother, and mother.  ROS:   Please see the  history of present illness.    No bleeding fevers chills nausea vomiting syncope all other systems reviewed and are negative.  EKGs/Labs/Other Studies Reviewed:    The following studies were reviewed today: ECHO 2022:    1. Left ventricular ejection fraction, by estimation, is 45 to 50%. The  left ventricle has mildly decreased function. The left ventricle  demonstrates regional wall motion abnormalities (see scoring  diagram/findings for description). There is mild  asymmetric left ventricular hypertrophy of the septal segment. Left  ventricular diastolic parameters are consistent with Grade I diastolic  dysfunction (impaired relaxation). No obvious apical LV mural thrombus.  Wall motion abnormality consistent with  ischemic cardiomyopathy and prior LAD distribution infarct.   2. Right ventricular systolic function is normal. The right ventricular  size is normal. There is normal pulmonary artery systolic pressure. The  estimated right ventricular systolic pressure is [id number] mmHg.   3. The mitral valve is grossly normal. Trivial mitral valve  regurgitation.   4. The aortic valve is tricuspid. Aortic valve regurgitation is not  visualized.   5. The inferior vena cava is normal in size with greater than 50%  respiratory variability, suggesting right atrial pressure of 3 mmHg.  ECHO 2019 - Left ventricle: The cavity size was normal. Systolic function was    mildly to moderately reduced. The estimated ejection fraction was    in the range of 40% to 45%. Aneurysmal deformity of the apical    myocardium; consistent with infarction in the distribution of the    left anterior descending coronary artery. Doppler parameters are    consistent with abnormal left ventricular relaxation (grade 1    diastolic dysfunction). No evidence of thrombus  EKG:  EKG is  ordered today.  The ekg ordered today demonstrates sinus rhythm 72 left axis deviation nonspecific ST-T wave changes prior sinus rhythm  64 left anterior fascicular block old inferior infarct pattern T wave inversion noted in V3 V4 V5, no significant change from prior  Recent Labs: 07/09/2021: ALT 20 02/10/2022: BUN 15; Creatinine, Ser 1.37; Potassium 3.8; Sodium 142  Recent Lipid Panel    Component Value Date/Time   CHOL 149 07/09/2021 1554   CHOL 149 04/14/2021 1519   TRIG 141 07/09/2021 1554   HDL 40 (L) 07/09/2021 1554   HDL 43 04/14/2021 1519   CHOLHDL 3.7 07/09/2021 1554   VLDL 28 07/09/2021 1554   LDLCALC 81 07/09/2021 1554   LDLCALC 78 04/14/2021 1519     Risk Assessment/Calculations:              Physical Exam:  VS:  BP 102/60   Pulse 72   Ht 5\' 10"  (1.778 m)   Wt 165 lb 12.8 oz (75.2 kg)   BMI 23.79 kg/m     Wt Readings from Last 3 Encounters:  06/02/22 165 lb 12.8 oz (75.2 kg)  01/26/22 164 lb (74.4 kg)  08/04/21 160 lb 0.6 oz (72.6 kg)     GEN:  Well nourished, well developed in no acute distress HEENT: Normal NECK: No JVD; No carotid bruits LYMPHATICS: No lymphadenopathy CARDIAC: RRR, no murmurs, no rubs, gallops RESPIRATORY:  Clear to auscultation without rales, wheezing or rhonchi  ABDOMEN: Soft, non-tender, non-distended MUSCULOSKELETAL:  No edema; No deformity  SKIN: Warm and dry NEUROLOGIC:  Alert and oriented x 3 PSYCHIATRIC:  Normal affect   ASSESSMENT:    1. Coronary artery disease involving native coronary artery of native heart without angina pectoris   2. Essential hypertension   3. SOB (shortness of breath)    PLAN:    In order of problems listed above:  Coronary artery disease History of CAD status post anterior MI 1999 treated with LAD stenting in [place].  He had a Myoview performed 11/13/2010 that showed apical scar and 2D echo revealed EF of 45 to 50% with moderate to severe apical hypokinesia.  Similar in 2022 echo  Dyspnea We will go ahead and check echocardiogram to ensure that there is no changes in structure and function.   Ischemic  cardiomyopathy History of LAD infarct 20 years ago in 1999 with LAD stenting.  Prior echo performed 01/14/2009 which revealed EF of 45 to 50% with an apical wall motion normality.  Similar in 2022 echo.   Essential hypertension History of essential hypertension with blood pressure measured today at 110/80.  He is on amlodipine, metoprolol and lisinopril.   Hyperlipidemia Previously added Zetia to Crestor 40 to help reduce his LDL to less than 70.       Medication Adjustments/Labs and Tests Ordered: Current medicines are reviewed at length with the patient today.  Concerns regarding medicines are outlined above.  Orders Placed This Encounter  Procedures   EKG 12-Lead   ECHOCARDIOGRAM COMPLETE   No orders of the defined types were placed in this encounter.   Patient Instructions  Medication Instructions:  Your physician recommends that you continue on your current medications as directed. Please refer to the Current Medication list given to you today.   Labwork: None today  Testing/Procedures: Your physician has requested that you have an echocardiogram. Echocardiography is a painless test that uses sound waves to create images of your heart. It provides your doctor with information about the size and shape of your heart and how well your heart's chambers and valves are working. This procedure takes approximately one hour. There are no restrictions for this procedure.   Follow-Up: 1 year  Any Other Special Instructions Will Be Listed Below (If Applicable).  If you need a refill on your cardiac medications before your next appointment, please call your pharmacy.    Signed, Candee Furbish, MD  06/02/2022 2:53 PM    [place]

## 2022-06-02 NOTE — Telephone Encounter (Signed)
Pt needs refills on   omeprazole (PRILOSEC) 20 MG capsule

## 2022-06-05 ENCOUNTER — Other Ambulatory Visit: Payer: Self-pay

## 2022-06-05 MED ORDER — EZETIMIBE 10 MG PO TABS: 10.0000 mg | ORAL_TABLET | Freq: Every day | ORAL | 3 refills | Status: DC

## 2022-06-12 ENCOUNTER — Ambulatory Visit (HOSPITAL_COMMUNITY)
Admission: RE | Admit: 2022-06-12 | Discharge: 2022-06-12 | Disposition: A | Discharge status: Home / Self Care | Payer: Medicare Other | Source: Ambulatory Visit | Attending: Cardiology | Admitting: Cardiology

## 2022-06-12 DIAGNOSIS — R0602 Shortness of breath: Secondary | ICD-10-CM | POA: Insufficient documentation

## 2022-06-12 NOTE — Progress Notes (Signed)
*  PRELIMINARY RESULTS* Echocardiogram 2D Echocardiogram has been performed.  James Ortega 06/12/2022, 3:46 PM

## 2022-06-15 LAB — ECHOCARDIOGRAM COMPLETE
Area-P 1/2: 3.12 cm2
S' Lateral: 3.2 cm

## 2022-06-17 ENCOUNTER — Telehealth: Payer: Self-pay

## 2022-06-17 DIAGNOSIS — Z79899 Other long term (current) drug therapy: Secondary | ICD-10-CM

## 2022-06-17 MED ORDER — SPIRONOLACTONE 25 MG PO TABS
12.5000 mg | ORAL_TABLET | Freq: Every day | ORAL | 3 refills | Status: DC
Start: 2022-06-17 — End: 2023-03-31

## 2022-06-17 NOTE — Telephone Encounter (Signed)
Reults discussed with patient.He agrees to start Spironolactone 12.5 mg qd.He asked it be sent to mail order.2 weeks after starting, he will have bmet done at [facility]

## 2022-06-17 NOTE — Telephone Encounter (Signed)
-----   Message from Jerline Pain, MD sent at 06/16/2022  6:49 AM EDT ----- EF still in the 40-45% range from prior heart attack. No significant change.  Would like him to start spironolactone 12.5mg  once a day as part of goal directed medical therapy for this.   Check BMET in 2 weeks to monitor potassium and creatinine. Candee Furbish, MD

## 2022-06-18 ENCOUNTER — Other Ambulatory Visit: Payer: Self-pay | Admitting: Cardiology

## 2022-06-22 ENCOUNTER — Other Ambulatory Visit: Payer: Self-pay | Admitting: Cardiology

## 2022-06-22 DIAGNOSIS — I10 Essential (primary) hypertension: Secondary | ICD-10-CM

## 2022-06-24 ENCOUNTER — Other Ambulatory Visit: Payer: Self-pay | Admitting: Cardiology

## 2022-06-24 DIAGNOSIS — I10 Essential (primary) hypertension: Secondary | ICD-10-CM

## 2022-07-21 ENCOUNTER — Other Ambulatory Visit (HOSPITAL_COMMUNITY)
Admission: RE | Admit: 2022-07-21 | Discharge: 2022-07-21 | Disposition: A | Discharge status: Home / Self Care | Payer: Medicare Other | Source: Ambulatory Visit | Attending: Cardiology | Admitting: Cardiology

## 2022-07-21 DIAGNOSIS — Z79899 Other long term (current) drug therapy: Secondary | ICD-10-CM | POA: Insufficient documentation

## 2022-07-21 LAB — BASIC METABOLIC PANEL
Anion gap: 5 (ref 5–15)
BUN: 19 mg/dL (ref 8–23)
CO2: 28 mmol/L (ref 22–32)
Calcium: 9.4 mg/dL (ref 8.9–10.3)
Chloride: 108 mmol/L (ref 98–111)
Creatinine, Ser: 1.47 mg/dL — ABNORMAL HIGH (ref 0.61–1.24)
GFR, Estimated: 51 mL/min — ABNORMAL LOW (ref >60)
Glucose, Bld: 117 mg/dL — ABNORMAL HIGH (ref 70–99)
Potassium: 4.6 mmol/L (ref 3.5–5.1)
Sodium: 141 mmol/L (ref 135–145)

## 2022-07-22 ENCOUNTER — Telehealth: Payer: Self-pay

## 2022-07-22 NOTE — Telephone Encounter (Signed)
[place] office pt:  Attempted to contact pt to discuss medication. I do not see any documentation of Dr Anne Fu changing pt's Lisinopril to Losartan.  Last OV note from 06/02/22 states for pt to continue current medications which include Lisinopril 40 mg.  On 10/25 spironolactone 12.5 mg daily was started after echo.  No mention of changing to Losartan.  Will send to Dr Anne Fu for review and any new orders.

## 2022-07-22 NOTE — Telephone Encounter (Signed)
Pt called stating Dr Anne Fu change his Lisinopril 40mg  QD to Losartan 50mg  QD. Pt request that [facility] with a 90 day dispense. Thank you

## 2022-07-23 ENCOUNTER — Telehealth: Payer: Self-pay

## 2022-07-23 MED ORDER — LOSARTAN POTASSIUM 50 MG PO TABS
50.0000 mg | ORAL_TABLET | Freq: Every day | ORAL | 3 refills | Status: DC
Start: 2022-07-23 — End: 2022-12-02

## 2022-07-23 NOTE — Telephone Encounter (Signed)
Lab results discussed with patient and he agrees to stop lisinopril and start losartan 50 mg qd. E-scribed to [facility] [facility] [facility]

## 2022-07-23 NOTE — Telephone Encounter (Signed)
Lab results discussed with patient and he agrees to stop Lisinopril and start Losartan 50 mg daily, e[facility] to [facility]

## 2022-07-23 NOTE — Telephone Encounter (Signed)
-----   Message from Jake Bathe, MD sent at 07/22/2022  7:07 AM EST ----- Creatinine 1.47 fairly stable from 5 months ago at 1.37. This is after addition of spironolactone 12.5 mg once a day for goal-directed medical therapy given his reduced ejection fraction of 45%.  Potassium is 4.6.  Stable.  Lets change his lisinopril 40 mg over to losartan 50 mg once a day.   Donato Schultz, MD

## 2022-07-28 ENCOUNTER — Ambulatory Visit: Payer: TRICARE For Life (TFL) | Admitting: Internal Medicine

## 2022-09-16 ENCOUNTER — Ambulatory Visit: Payer: TRICARE For Life (TFL) | Admitting: Internal Medicine

## 2022-09-23 ENCOUNTER — Ambulatory Visit: Payer: TRICARE For Life (TFL) | Admitting: Internal Medicine

## 2022-09-30 ENCOUNTER — Encounter: Payer: Self-pay | Admitting: Internal Medicine

## 2022-09-30 ENCOUNTER — Ambulatory Visit (INDEPENDENT_AMBULATORY_CARE_PROVIDER_SITE_OTHER): Payer: Medicare Other | Admitting: Internal Medicine

## 2022-09-30 VITALS — BP 119/80 | HR 82 | Ht 70.0 in | Wt 167.8 lb

## 2022-09-30 DIAGNOSIS — I255 Ischemic cardiomyopathy: Secondary | ICD-10-CM | POA: Diagnosis not present

## 2022-09-30 DIAGNOSIS — F418 Other specified anxiety disorders: Secondary | ICD-10-CM

## 2022-09-30 DIAGNOSIS — R739 Hyperglycemia, unspecified: Secondary | ICD-10-CM

## 2022-09-30 DIAGNOSIS — R1031 Right lower quadrant pain: Secondary | ICD-10-CM

## 2022-09-30 DIAGNOSIS — F17210 Nicotine dependence, cigarettes, uncomplicated: Secondary | ICD-10-CM

## 2022-09-30 DIAGNOSIS — K529 Noninfective gastroenteritis and colitis, unspecified: Secondary | ICD-10-CM | POA: Diagnosis not present

## 2022-09-30 DIAGNOSIS — I10 Essential (primary) hypertension: Secondary | ICD-10-CM

## 2022-09-30 DIAGNOSIS — Z125 Encounter for screening for malignant neoplasm of prostate: Secondary | ICD-10-CM

## 2022-09-30 DIAGNOSIS — N1831 Chronic kidney disease, stage 3a: Secondary | ICD-10-CM

## 2022-09-30 DIAGNOSIS — I2510 Atherosclerotic heart disease of native coronary artery without angina pectoris: Secondary | ICD-10-CM

## 2022-09-30 DIAGNOSIS — N400 Enlarged prostate without lower urinary tract symptoms: Secondary | ICD-10-CM

## 2022-09-30 DIAGNOSIS — F17211 Nicotine dependence, cigarettes, in remission: Secondary | ICD-10-CM

## 2022-09-30 DIAGNOSIS — E782 Mixed hyperlipidemia: Secondary | ICD-10-CM

## 2022-09-30 MED ORDER — AMOXICILLIN-POT CLAVULANATE 875-125 MG PO TABS: 1.0000 | ORAL_TABLET | Freq: Two times a day (BID) | ORAL | 0 refills | Status: DC

## 2022-10-01 ENCOUNTER — Encounter (INDEPENDENT_AMBULATORY_CARE_PROVIDER_SITE_OTHER): Payer: Self-pay | Admitting: *Deleted

## 2022-10-01 LAB — LIPID PANEL
Chol/HDL Ratio: 2.7 ratio (ref 0.0–5.0)
Chol/HDL Ratio: 2.6 ratio (ref 0.0–5.0)
Cholesterol, Total: 105 mg/dL (ref 100–199)
Cholesterol, Total: 104 mg/dL (ref 100–199)
HDL: 39 mg/dL — ABNORMAL LOW (ref >39)
HDL: 40 mg/dL (ref >39)
LDL Chol Calc (NIH): 48 mg/dL (ref 0–99)
LDL Chol Calc (NIH): 46 mg/dL (ref 0–99)
Triglycerides: 94 mg/dL (ref 0–149)
Triglycerides: 92 mg/dL (ref 0–149)
VLDL Cholesterol Cal: 18 mg/dL (ref 5–40)
VLDL Cholesterol Cal: 18 mg/dL (ref 5–40)

## 2022-10-01 LAB — CBC WITH DIFFERENTIAL/PLATELET
Basophils Absolute: 0.1 10*3/uL (ref 0.0–0.2)
Basos: 1 %
EOS (ABSOLUTE): 0.1 10*3/uL (ref 0.0–0.4)
Eos: 1 %
Hematocrit: 35.6 % — ABNORMAL LOW (ref 37.5–51.0)
Hemoglobin: 12.4 g/dL — ABNORMAL LOW (ref 13.0–17.7)
Immature Grans (Abs): 0 10*3/uL (ref 0.0–0.1)
Immature Granulocytes: 0 %
Lymphocytes Absolute: 1.5 10*3/uL (ref 0.7–3.1)
Lymphs: 15 %
MCH: 33.1 pg — ABNORMAL HIGH (ref 26.6–33.0)
MCHC: 34.8 g/dL (ref 31.5–35.7)
MCV: 95 fL (ref 79–97)
Monocytes Absolute: 0.7 10*3/uL (ref 0.1–0.9)
Monocytes: 7 %
Neutrophils Absolute: 7.9 10*3/uL — ABNORMAL HIGH (ref 1.4–7.0)
Neutrophils: 76 %
Platelets: 223 10*3/uL (ref 150–450)
RBC: 3.75 x10E6/uL — ABNORMAL LOW (ref 4.14–5.80)
RDW: 13.9 % (ref 11.6–15.4)
WBC: 10.2 10*3/uL (ref 3.4–10.8)

## 2022-10-01 LAB — PSA: Prostate Specific Ag, Serum: 1.8 ng/mL (ref 0.0–4.0)

## 2022-10-01 LAB — HEMOGLOBIN A1C
Est. average glucose Bld gHb Est-mCnc: 120 mg/dL
Hgb A1c MFr Bld: 5.8 % — ABNORMAL HIGH (ref 4.8–5.6)

## 2022-10-02 ENCOUNTER — Encounter: Payer: Self-pay | Admitting: Internal Medicine

## 2022-10-02 DIAGNOSIS — K529 Noninfective gastroenteritis and colitis, unspecified: Secondary | ICD-10-CM | POA: Insufficient documentation

## 2022-10-02 NOTE — Assessment & Plan Note (Addendum)
Last BMP reviewed Advised to maintain adequate hydration Avoid nephrotoxic agents On lisinopril and spironolactone

## 2022-10-02 NOTE — Assessment & Plan Note (Addendum)
Echo reviewed He states that he had LA thrombus. His previous PCP mentioned about it. No known h/o paroxysmal A Fib. Followed by cardiology - continue Eliquis On ACEi, Spironolactone and statin Appears euvolemic currently

## 2022-10-02 NOTE — Assessment & Plan Note (Signed)
Has been taking clonazepam now Was started on Lexapro by his Psychiatrist, takes it now Has been trying relaxation techniques at home

## 2022-10-02 NOTE — Progress Notes (Signed)
Returned call

## 2022-10-02 NOTE — Assessment & Plan Note (Signed)
H/o MI (1999) treated with LAD stenting in [place] On B-blocker On Eliquis for h/o LA thrombus, although last echo was negative for LA or LV thrombus Has seen Cardiology

## 2022-10-02 NOTE — Assessment & Plan Note (Addendum)
BP Readings from Last 1 Encounters:  09/30/22 119/80   Well-controlled with Losartan, Amlodipine, Spironolactone and Metoprolol Counseled for compliance with the medications Advised DASH diet and moderate exercise/walking, at least 150 mins/week

## 2022-10-02 NOTE — Assessment & Plan Note (Signed)
Will need to obtain record from old GI provider to confirm diagnosis of IBD Would avoid refilling Mesalamine without clear reasoning

## 2022-10-02 NOTE — Patient Instructions (Signed)
Please start taking Augmentin as prescribed for colitis.  You are being referred to GI for evaluation of IBD.  Please continue taking medications as prescribed.  Please continue to follow low salt diet and ambulate as tolerated.

## 2022-10-02 NOTE — Assessment & Plan Note (Signed)
On Crestor

## 2022-10-02 NOTE — Assessment & Plan Note (Signed)
Smokes about 0.5 pack/day Used to smoke 1 pack/day for more than 40 years Wants to think about low-dose CT chest for now  Asked about quitting: confirms that he currently smokes cigarettes Advise to quit smoking: Educated about QUITTING to reduce the risk of cancer, cardio and cerebrovascular disease. Assess willingness: Unwilling to quit at this time, but is working on cutting back. Assist with counseling and pharmacotherapy: Counseled for 5 minutes and literature provided. Arrange for follow up: Follow up in 3 months and continue to offer help.

## 2022-10-02 NOTE — Progress Notes (Signed)
Established Patient Office Visit  Subjective:  Patient ID: James Ortega, male    DOB: 03-Feb-1951  Age: 72 y.o. MRN: [id number]  CC:  Chief Complaint  Patient presents with   Hypertension    Six month follow up. He is wanting a refill on mesalamine.    HPI James Ortega is a 72 y.o. male with past medical history of CAD s/p stent placement, ischemic cardiomyopathy, HTN, CKD stage 3a, depression with anxiety and tobacco abuse who presents for f/u of his chronic medical conditions.  He reports RLQ abdominal pain for the last 1 week.  He also had loose BM/the diarrhea, which has improved now.  He has a bottle of mesalamine, which he has been taking for the last 3 to 4 days with some improvement in his symptoms.  He used to see Dr. Bryn Gulling for history of IBD, but has not seen him since 2020. He takes Mesalamine PRN instead of daily.  He currently denies any melena or hematochezia.  Denies any fever or chills.  BP is well-controlled. Takes medications regularly. Patient denies headache, dizziness, chest pain, dyspnea or palpitations.   He was seen by cardiologist for history of CAD.  He had echo, which did not show any LA or LV thrombus.  He is still taking Eliquis, continued by his cardiologist.  Denies any bleeding issues currently.  He continues to smoke about 0.5 per day.   Past Medical History:  Diagnosis Date   History of acute anterior wall MI    Hyperlipidemia    Hypertension     Past Surgical History:  Procedure Laterality Date   AMPUTATION Left 03/24/2016   Procedure: REVISION AMPUTATION LEFT INDEX FINGER;  Surgeon: Roseanne Kaufman, MD;  Location: [place];  Service: Orthopedics;  Laterality: Left;   CORONARY ANGIOPLASTY WITH STENT PLACEMENT  05/1998   bare metal stent [facility] [place]   NM MYOCAR PERF WALL MOTION  11/13/2010   No significant ischemia demonstrated. Low risk scan   US ECHOCARDIOGRAPHY  01/14/2009   Moderate to severe apical wall  hypokinesis, trace TR,MR, doppler suggestive of impaired LV relaxation    Family History  Problem Relation Age of Onset   Diabetes Mother    Diabetes Maternal Grandmother    Diabetes Maternal Grandfather     Social History   Socioeconomic History   Marital status: Married    Spouse name: Not on file   Number of children: Not on file   Years of education: Not on file   Highest education level: Not on file  Occupational History   Not on file  Tobacco Use   Smoking status: Every Day    Packs/day: 1.00    Types: Cigarettes   Smokeless tobacco: Never  Vaping Use   Vaping Use: Never used  Substance and Sexual Activity   Alcohol use: No    Alcohol/week: 0.0 standard drinks of alcohol   Drug use: No   Sexual activity: Yes  Other Topics Concern   Not on file  Social History Narrative   Not on file   Social Determinants of Health   Financial Resource Strain: Not on file  Food Insecurity: Not on file  Transportation Needs: Not on file  Physical Activity: Not on file  Stress: Not on file  Social Connections: Not on file  Intimate Partner Violence: Not on file    Outpatient Medications Prior to Visit  Medication Sig Dispense Refill   clonazePAM (KLONOPIN) 1 MG  tablet      escitalopram (LEXAPRO) 20 MG tablet Take 20 mg by mouth daily.     losartan (COZAAR) 50 MG tablet Take 1 tablet (50 mg total) by mouth daily. 90 tablet 3   amLODipine (NORVASC) 5 MG tablet TAKE 1 TABLET DAILY 90 tablet 3   cetirizine (ZYRTEC) 10 MG tablet Take 1 tablet (10 mg total) by mouth daily. 30 tablet 11   ELIQUIS 5 MG TABS tablet TAKE 1 TABLET TWICE A DAY 180 tablet 3   ezetimibe (ZETIA) 10 MG tablet Take 1 tablet (10 mg total) by mouth daily. 90 tablet 3   fluticasone (FLONASE) 50 MCG/ACT nasal spray Place 2 sprays into both nostrils daily. 16 g 6   metoprolol succinate (TOPROL-XL) 50 MG 24 hr tablet TAKE 1 TABLET DAILY WITH OR IMMEDIATELY FOLLOWING A MEAL 90 tablet 3   omeprazole (PRILOSEC) 20  MG capsule Take 1 capsule (20 mg total) by mouth daily. 90 capsule 3   rosuvastatin (CRESTOR) 40 MG tablet TAKE 1 TABLET DAILY 90 tablet 3   spironolactone (ALDACTONE) 25 MG tablet Take 0.5 tablets (12.5 mg total) by mouth daily. 45 tablet 3   VITAMIN D PO Take 5,000 mg by mouth daily.     vitamin E 400 UNIT capsule Take 800 Units by mouth daily.     No facility-administered medications prior to visit.    No Known Allergies  ROS Review of Systems  Constitutional:  Negative for chills and fever.  HENT:  Negative for congestion and sore throat.   Eyes:  Negative for pain and discharge.  Respiratory:  Negative for cough and shortness of breath.   Cardiovascular:  Negative for chest pain and palpitations.  Gastrointestinal:  Positive for abdominal pain and diarrhea. Negative for nausea and vomiting.  Endocrine: Negative for polydipsia and polyuria.  Genitourinary:  Negative for dysuria and hematuria.  Musculoskeletal:  Negative for neck pain and neck stiffness.  Skin:  Negative for rash.  Neurological:  Negative for dizziness, weakness, numbness and headaches.  Psychiatric/Behavioral:  Positive for sleep disturbance. Negative for agitation and behavioral problems.       Objective:    Physical Exam Vitals reviewed.  Constitutional:      General: He is not in acute distress.    Appearance: He is not diaphoretic.  HENT:     Head: Normocephalic and atraumatic.     Nose: Nose normal.     Mouth/Throat:     Mouth: Mucous membranes are moist.  Eyes:     General: No scleral icterus.    Extraocular Movements: Extraocular movements intact.  Cardiovascular:     Rate and Rhythm: Normal rate and regular rhythm.     Pulses: Normal pulses.     Heart sounds: Normal heart sounds. No murmur heard. Pulmonary:     Breath sounds: Normal breath sounds. No wheezing or rales.  Abdominal:     Palpations: Abdomen is soft.     Tenderness: There is abdominal tenderness (Mild, RLQ).   Musculoskeletal:     Cervical back: Neck supple. No tenderness.     Right lower leg: No edema.     Left lower leg: No edema.  Skin:    General: Skin is warm.     Findings: No rash.  Neurological:     General: No focal deficit present.     Mental Status: He is alert and oriented to person, place, and time.     Sensory: No sensory deficit.  Motor: No weakness.  Psychiatric:        Mood and Affect: Mood normal.        Behavior: Behavior normal.     BP 119/80 (BP Location: Right Arm, Patient Position: Sitting, Cuff Size: Normal)   Pulse 82   Ht 5' 10"$  (1.778 m)   Wt 167 lb 12.8 oz (76.1 kg)   SpO2 98%   BMI 24.08 kg/m  Wt Readings from Last 3 Encounters:  09/30/22 167 lb 12.8 oz (76.1 kg)  06/02/22 165 lb 12.8 oz (75.2 kg)  01/26/22 164 lb (74.4 kg)    No results found for: "TSH" Lab Results  Component Value Date   WBC 10.2 09/30/2022   HGB 12.4 (L) 09/30/2022   HCT 35.6 (L) 09/30/2022   MCV 95 09/30/2022   PLT 223 09/30/2022   Lab Results  Component Value Date   NA 141 07/21/2022   K 4.6 07/21/2022   CO2 28 07/21/2022   GLUCOSE 117 (H) 07/21/2022   BUN 19 07/21/2022   CREATININE 1.47 (H) 07/21/2022   BILITOT 0.5 04/14/2021   ALKPHOS 62 04/14/2021   AST 24 04/14/2021   ALT 20 07/09/2021   PROT 6.7 04/14/2021   ALBUMIN 4.7 04/14/2021   CALCIUM 9.4 07/21/2022   ANIONGAP 5 07/21/2022   EGFR 55 (L) 02/10/2022   Lab Results  Component Value Date   CHOL 104 09/30/2022   Lab Results  Component Value Date   HDL 40 09/30/2022   Lab Results  Component Value Date   LDLCALC 46 09/30/2022   Lab Results  Component Value Date   TRIG 92 09/30/2022   Lab Results  Component Value Date   CHOLHDL 2.6 09/30/2022   Lab Results  Component Value Date   HGBA1C 5.8 (H) 09/30/2022      Assessment & Plan:   Problem List Items Addressed This Visit       Cardiovascular and Mediastinum   Coronary artery disease (Chronic)    H/o MI (1999) treated with LAD  stenting in [place] On B-blocker On Eliquis for h/o LA thrombus, although last echo was negative for LA or LV thrombus Has seen Cardiology      Relevant Orders   Lipid Profile (Completed)   Ischemic cardiomyopathy (Chronic)    Echo reviewed He states that he had LA thrombus. His previous PCP mentioned about it. No known h/o paroxysmal A Fib. Followed by cardiology - continue Eliquis On ACEi, Spironolactone and statin Appears euvolemic currently      Essential hypertension (Chronic)    BP Readings from Last 1 Encounters:  09/30/22 119/80  Well-controlled with Losartan, Amlodipine, Spironolactone and Metoprolol Counseled for compliance with the medications Advised DASH diet and moderate exercise/walking, at least 150 mins/week        Digestive   Acute colitis - Primary    Unclear if he has acute colitis or flareup of IBD He has been on mesalamine, which suggests he has history of IBD, although very need to get records from his old GI He prefers to get a local GI referral, provided Started Augmentin for acute colitis Check CT abdomen pelvis      Relevant Medications   amoxicillin-clavulanate (AUGMENTIN) 875-125 MG tablet   Other Relevant Orders   Ambulatory referral to Gastroenterology     Genitourinary   Stage 3a chronic kidney disease ([place])    Last BMP reviewed Advised to maintain adequate hydration Avoid nephrotoxic agents On lisinopril and spironolactone      Relevant  Orders   CBC with Differential/Platelet (Completed)     Other   Hyperlipidemia (Chronic)    On Crestor      Relevant Orders   Lipid Profile (Completed)   Depression with anxiety    Has been taking clonazepam now Was started on Lexapro by his Psychiatrist, takes it now Has been trying relaxation techniques at home      Relevant Medications   escitalopram (LEXAPRO) 20 MG tablet   Cigarette nicotine dependence in remission    Smokes about 0.5 pack/day Used to smoke 1 pack/day for more than 40  years Wants to think about low-dose CT chest for now  Asked about quitting: confirms that he currently smokes cigarettes Advise to quit smoking: Educated about QUITTING to reduce the risk of cancer, cardio and cerebrovascular disease. Assess willingness: Unwilling to quit at this time, but is working on cutting back. Assist with counseling and pharmacotherapy: Counseled for 5 minutes and literature provided. Arrange for follow up: Follow up in 3 months and continue to offer help.      Inflammatory bowel disease    Will need to obtain record from old GI provider to confirm diagnosis of IBD Would avoid refilling Mesalamine without clear reasoning      Relevant Orders   Ambulatory referral to Gastroenterology   Other Visit Diagnoses     Right lower quadrant abdominal pain       Relevant Orders   CT Abdomen Pelvis Wo Contrast   Hyperglycemia       Relevant Orders   Hemoglobin A1c (Completed)   Benign prostatic hyperplasia, unspecified whether lower urinary tract symptoms present       Relevant Orders   PSA (Completed)       Meds ordered this encounter  Medications   amoxicillin-clavulanate (AUGMENTIN) 875-125 MG tablet    Sig: Take 1 tablet by mouth 2 (two) times daily.    Dispense:  14 tablet    Refill:  0     Follow-up: Return in about 6 months (around 03/31/2023).    Lindell Spar, MD

## 2022-10-02 NOTE — Assessment & Plan Note (Signed)
Unclear if he has acute colitis or flareup of IBD He has been on mesalamine, which suggests he has history of IBD, although very need to get records from his old GI He prefers to get a local GI referral, provided Started Augmentin for acute colitis Check CT abdomen pelvis

## 2022-10-15 ENCOUNTER — Ambulatory Visit (INDEPENDENT_AMBULATORY_CARE_PROVIDER_SITE_OTHER): Payer: TRICARE For Life (TFL) | Admitting: Gastroenterology

## 2022-10-19 ENCOUNTER — Ambulatory Visit (HOSPITAL_COMMUNITY): Payer: Medicare Other

## 2022-12-01 ENCOUNTER — Telehealth: Payer: Self-pay | Admitting: Cardiology

## 2022-12-01 ENCOUNTER — Encounter (INDEPENDENT_AMBULATORY_CARE_PROVIDER_SITE_OTHER): Payer: Medicare Other | Admitting: Cardiology

## 2022-12-01 DIAGNOSIS — I10 Essential (primary) hypertension: Secondary | ICD-10-CM | POA: Diagnosis not present

## 2022-12-01 NOTE — Telephone Encounter (Signed)
  Pt c/o medication issue:  1. Name of Medication: Losartan 50 mg daily   2. How are you currently taking this medication (dosage and times per day)?   3. Are you having a reaction (difficulty breathing--STAT)? No   4. What is your medication issue? Pt said, he is having reaction with this medication, he is having back pain, dizziness and fatigue. He said, if he can go back to his old medication lisinopril

## 2022-12-02 MED ORDER — LISINOPRIL 40 MG PO TABS: 40.0000 mg | ORAL_TABLET | Freq: Every day | ORAL | 3 refills | Status: DC

## 2022-12-02 NOTE — Telephone Encounter (Signed)
Please see the MyChart message reply(ies) for my assessment and plan.   Stopping losartan. Restarting lisinopril 40mg  PO QD   This patient gave consent for this Medical Advice Message and is aware that it may result in a bill to [employer], as well as the possibility of receiving a bill for a co-payment or deductible. They are an established patient, but are not seeking medical advice exclusively about a problem treated during an in person or video visit in the last seven days. I did not recommend an in person or video visit within seven days of my reply.    I spent a total of 5 minutes cumulative time within 7 days through [employer].  Donato Schultz, MD

## 2022-12-17 ENCOUNTER — Ambulatory Visit (INDEPENDENT_AMBULATORY_CARE_PROVIDER_SITE_OTHER): Payer: TRICARE For Life (TFL) | Admitting: Gastroenterology

## 2023-03-02 ENCOUNTER — Other Ambulatory Visit: Payer: Self-pay

## 2023-03-02 ENCOUNTER — Telehealth: Payer: Self-pay | Admitting: Internal Medicine

## 2023-03-02 DIAGNOSIS — K219 Gastro-esophageal reflux disease without esophagitis: Secondary | ICD-10-CM

## 2023-03-02 MED ORDER — OMEPRAZOLE 20 MG PO CPDR: 20.0000 mg | DELAYED_RELEASE_CAPSULE | Freq: Every day | ORAL | 3 refills | Status: DC

## 2023-03-02 NOTE — Telephone Encounter (Signed)
Prescription Request  03/02/2023  LOV: 09/30/2022  What is the name of the medication or equipment? omeprazole (PRILOSEC) 20 MG capsule   90 DAY SUPPLY WITH REFILLS  Have you contacted your pharmacy to request a refill? Yes   Which pharmacy would you like this sent to?   Pharmacy  [facility] - [place], [place] - [address], [place] [address] Phone: [phone]  Fax: [phone]   Patient notified that their request is being sent to the clinical staff for review and that they should receive a response within 2 business days.   Please advise at Mobile [phone] (mobile)

## 2023-03-02 NOTE — Telephone Encounter (Signed)
Refills sent to pharmacy. 

## 2023-03-31 ENCOUNTER — Ambulatory Visit: Payer: Medicare Other

## 2023-03-31 ENCOUNTER — Encounter: Payer: Self-pay | Admitting: Internal Medicine

## 2023-03-31 ENCOUNTER — Ambulatory Visit (INDEPENDENT_AMBULATORY_CARE_PROVIDER_SITE_OTHER): Payer: Medicare Other | Admitting: Internal Medicine

## 2023-03-31 VITALS — BP 97/61 | HR 70 | Ht 70.0 in | Wt 163.6 lb

## 2023-03-31 DIAGNOSIS — I10 Essential (primary) hypertension: Secondary | ICD-10-CM

## 2023-03-31 DIAGNOSIS — F3341 Major depressive disorder, recurrent, in partial remission: Secondary | ICD-10-CM

## 2023-03-31 DIAGNOSIS — Z7901 Long term (current) use of anticoagulants: Secondary | ICD-10-CM | POA: Diagnosis not present

## 2023-03-31 DIAGNOSIS — I2510 Atherosclerotic heart disease of native coronary artery without angina pectoris: Secondary | ICD-10-CM | POA: Diagnosis not present

## 2023-03-31 DIAGNOSIS — F17211 Nicotine dependence, cigarettes, in remission: Secondary | ICD-10-CM

## 2023-03-31 DIAGNOSIS — N1831 Chronic kidney disease, stage 3a: Secondary | ICD-10-CM

## 2023-03-31 DIAGNOSIS — I255 Ischemic cardiomyopathy: Secondary | ICD-10-CM

## 2023-03-31 NOTE — Assessment & Plan Note (Signed)
H/o MI (1999) treated with LAD stenting in [place] On B-blocker On Eliquis for h/o LA thrombus, although last echo was negative for LA or LV thrombus Has seen Cardiology 

## 2023-03-31 NOTE — Assessment & Plan Note (Signed)
Echo reviewed He states that he had LA thrombus. His previous PCP mentioned about it. No known h/o paroxysmal A Fib. Followed by cardiology - continue Eliquis On ACEi, Spironolactone and statin Appears euvolemic currently

## 2023-03-31 NOTE — Patient Instructions (Signed)
Please continue to take medications as prescribed.  Please continue to follow low salt diet and ambulate as tolerated.  Schedule your Medicare Annual Wellness Visit at checkout.

## 2023-03-31 NOTE — Progress Notes (Signed)
Established Patient Office Visit  Subjective:  Patient ID: James Ortega, male    DOB: 02-Feb-1951  Age: 72 y.o. MRN: [id number]  CC:  Chief Complaint  Patient presents with   Hypertension    Six month follow up     HPI James Ortega is a 72 y.o. male with past medical history of CAD s/p stent placement, ischemic cardiomyopathy, HTN, CKD stage 3a, depression with anxiety and tobacco abuse who presents for f/u of his chronic medical conditions.  BP is well-controlled. Takes amlodipine 5 mg daily, lisinopril 40 mg daily and metoprolol 50 mg daily regularly. Patient denies headache, dizziness, chest pain, dyspnea or palpitations.   He was seen by cardiologist for history of CAD.  He had echo, which did not show any LA or LV thrombus.  He is still taking Eliquis, continued by his cardiologist.  Denies any bleeding issues currently.  He continues to smoke about 0.5 pack per day.   Past Medical History:  Diagnosis Date   History of acute anterior wall MI    Hyperlipidemia    Hypertension     Past Surgical History:  Procedure Laterality Date   AMPUTATION Left 03/24/2016   Procedure: REVISION AMPUTATION LEFT INDEX FINGER;  Surgeon: Dominica Severin, MD;  Location: [facility];  Service: Orthopedics;  Laterality: Left;   CORONARY ANGIOPLASTY WITH STENT PLACEMENT  05/1998   bare metal stent [facility] [place]   NM MYOCAR PERF WALL MOTION  11/13/2010   No significant ischemia demonstrated. Low risk scan   US ECHOCARDIOGRAPHY  01/14/2009   Moderate to severe apical wall hypokinesis, trace TR,MR, doppler suggestive of impaired LV relaxation    Family History  Problem Relation Age of Onset   Diabetes Mother    Diabetes Maternal Grandmother    Diabetes Maternal Grandfather     Social History   Socioeconomic History   Marital status: Married    Spouse name: Not on file   Number of children: Not on file   Years of education: Not on file   Highest education level:  Not on file  Occupational History   Not on file  Tobacco Use   Smoking status: Every Day    Current packs/day: 1.00    Types: Cigarettes   Smokeless tobacco: Never  Vaping Use   Vaping status: Never Used  Substance and Sexual Activity   Alcohol use: No    Alcohol/week: 0.0 standard drinks of alcohol   Drug use: No   Sexual activity: Yes  Other Topics Concern   Not on file  Social History Narrative   Not on file   Social Determinants of Health   Financial Resource Strain: Not on file  Food Insecurity: Not on file  Transportation Needs: Not on file  Physical Activity: Not on file  Stress: Not on file  Social Connections: Not on file  Intimate Partner Violence: Not on file    Outpatient Medications Prior to Visit  Medication Sig Dispense Refill   amLODipine (NORVASC) 5 MG tablet TAKE 1 TABLET DAILY 90 tablet 3   clonazePAM (KLONOPIN) 1 MG tablet      ELIQUIS 5 MG TABS tablet TAKE 1 TABLET TWICE A DAY 180 tablet 3   ezetimibe (ZETIA) 10 MG tablet Take 1 tablet (10 mg total) by mouth daily. 90 tablet 3   fluticasone (FLONASE) 50 MCG/ACT nasal spray Place 2 sprays into both nostrils daily. 16 g 6   lisinopril (ZESTRIL) 40 MG tablet  Take 1 tablet (40 mg total) by mouth daily. 90 tablet 3   metoprolol succinate (TOPROL-XL) 50 MG 24 hr tablet TAKE 1 TABLET DAILY WITH OR IMMEDIATELY FOLLOWING A MEAL 90 tablet 3   omeprazole (PRILOSEC) 20 MG capsule Take 1 capsule (20 mg total) by mouth daily. 90 capsule 3   rosuvastatin (CRESTOR) 40 MG tablet TAKE 1 TABLET DAILY 90 tablet 3   VITAMIN D PO Take 5,000 mg by mouth daily.     vitamin E 400 UNIT capsule Take 800 Units by mouth daily.     amoxicillin-clavulanate (AUGMENTIN) 875-125 MG tablet Take 1 tablet by mouth 2 (two) times daily. 14 tablet 0   escitalopram (LEXAPRO) 20 MG tablet Take 20 mg by mouth daily.     spironolactone (ALDACTONE) 25 MG tablet Take 0.5 tablets (12.5 mg total) by mouth daily. 45 tablet 3   No  facility-administered medications prior to visit.    No Known Allergies  ROS Review of Systems  Constitutional:  Negative for chills and fever.  HENT:  Negative for congestion and sore throat.   Eyes:  Negative for pain and discharge.  Respiratory:  Negative for cough and shortness of breath.   Cardiovascular:  Negative for chest pain and palpitations.  Gastrointestinal:  Negative for abdominal pain, nausea and vomiting.  Endocrine: Negative for polydipsia and polyuria.  Genitourinary:  Negative for dysuria and hematuria.  Musculoskeletal:  Negative for neck pain and neck stiffness.  Skin:  Negative for rash.  Neurological:  Negative for dizziness, weakness, numbness and headaches.  Psychiatric/Behavioral:  Negative for agitation and behavioral problems.       Objective:    Physical Exam Vitals reviewed.  Constitutional:      General: He is not in acute distress.    Appearance: He is not diaphoretic.  HENT:     Head: Normocephalic and atraumatic.     Nose: Nose normal.     Mouth/Throat:     Mouth: Mucous membranes are moist.  Eyes:     General: No scleral icterus.    Extraocular Movements: Extraocular movements intact.  Cardiovascular:     Rate and Rhythm: Normal rate and regular rhythm.     Pulses: Normal pulses.     Heart sounds: Normal heart sounds. No murmur heard. Pulmonary:     Breath sounds: Normal breath sounds. No wheezing or rales.  Musculoskeletal:     Cervical back: Neck supple. No tenderness.     Right lower leg: No edema.     Left lower leg: No edema.  Skin:    General: Skin is warm.     Findings: No rash.  Neurological:     General: No focal deficit present.     Mental Status: He is alert and oriented to person, place, and time.     Sensory: No sensory deficit.     Motor: No weakness.  Psychiatric:        Mood and Affect: Mood normal.        Behavior: Behavior normal.     BP 97/61 (BP Location: Left Arm, Patient Position: Sitting, Cuff Size:  Normal)   Pulse 70   Ht 5\' 10"  (1.778 m)   Wt 163 lb 9.6 oz (74.2 kg)   SpO2 96%   BMI 23.47 kg/m  Wt Readings from Last 3 Encounters:  03/31/23 163 lb 9.6 oz (74.2 kg)  09/30/22 167 lb 12.8 oz (76.1 kg)  06/02/22 165 lb 12.8 oz (75.2 kg)    No  results found for: "TSH" Lab Results  Component Value Date   WBC 10.2 09/30/2022   HGB 12.4 (L) 09/30/2022   HCT 35.6 (L) 09/30/2022   MCV 95 09/30/2022   PLT 223 09/30/2022   Lab Results  Component Value Date   NA 141 07/21/2022   K 4.6 07/21/2022   CO2 28 07/21/2022   GLUCOSE 117 (H) 07/21/2022   BUN 19 07/21/2022   CREATININE 1.47 (H) 07/21/2022   BILITOT 0.5 04/14/2021   ALKPHOS 62 04/14/2021   AST 24 04/14/2021   ALT 20 07/09/2021   PROT 6.7 04/14/2021   ALBUMIN 4.7 04/14/2021   CALCIUM 9.4 07/21/2022   ANIONGAP 5 07/21/2022   EGFR 55 (L) 02/10/2022   Lab Results  Component Value Date   CHOL 104 09/30/2022   Lab Results  Component Value Date   HDL 40 09/30/2022   Lab Results  Component Value Date   LDLCALC 46 09/30/2022   Lab Results  Component Value Date   TRIG 92 09/30/2022   Lab Results  Component Value Date   CHOLHDL 2.6 09/30/2022   Lab Results  Component Value Date   HGBA1C 5.8 (H) 09/30/2022      Assessment & Plan:   Problem List Items Addressed This Visit       Cardiovascular and Mediastinum   Coronary artery disease - Primary (Chronic)    H/o MI (1999) treated with LAD stenting in [place] On B-blocker On Eliquis for h/o LA thrombus, although last echo was negative for LA or LV thrombus Has seen Cardiology      Relevant Orders   CMP14+EGFR   Ischemic cardiomyopathy (Chronic)    Echo reviewed He states that he had LA thrombus. His previous PCP mentioned about it. No known h/o paroxysmal A Fib. Followed by cardiology - continue Eliquis On ACEi, Spironolactone and statin Appears euvolemic currently      Essential hypertension (Chronic)    BP Readings from Last 1 Encounters:   03/31/23 97/61   Well-controlled with Losartan, Amlodipine, Spironolactone and Metoprolol Counseled for compliance with the medications Advised DASH diet and moderate exercise/walking, at least 150 mins/week      Relevant Orders   CMP14+EGFR     Genitourinary   Stage 3a chronic kidney disease (HCC)    Last BMP reviewed Advised to maintain adequate hydration Avoid nephrotoxic agents On lisinopril and spironolactone Check CMP, PTH and Phosphorus      Relevant Orders   CBC with Differential/Platelet   CMP14+EGFR   Parathyroid hormone, intact (no Ca)   Phosphorus     Other   MDD (major depressive disorder), recurrent, in partial remission (HCC)    Well-controlled On Lexapro, followed by his Psychiatrist, takes it now Has been trying relaxation techniques at home Has been taking clonazepam for GAD      Cigarette nicotine dependence in remission    Smokes about 0.5 pack/day Used to smoke 1 pack/day for more than 40 years Wants to think about low-dose CT chest for now  Asked about quitting: confirms that he currently smokes cigarettes Advise to quit smoking: Educated about QUITTING to reduce the risk of cancer, cardio and cerebrovascular disease. Assess willingness: Unwilling to quit at this time, but is working on cutting back. Assist with counseling and pharmacotherapy: Counseled for 5 minutes and literature provided. Arrange for follow up: Follow up in 3 months and continue to offer help.      Chronic anticoagulation    On Eliquis for ?LA thrombus Has normocytic  anemia Denies active bleeding currently Check CBC      Relevant Orders   CBC with Differential/Platelet     No orders of the defined types were placed in this encounter.    Follow-up: Return in about 6 months (around 10/01/2023) for HTN and HLD.    Anabel Halon, MD

## 2023-03-31 NOTE — Assessment & Plan Note (Addendum)
Well-controlled On Lexapro, followed by his Psychiatrist, takes it now Has been trying relaxation techniques at home Has been taking clonazepam for GAD

## 2023-03-31 NOTE — Assessment & Plan Note (Signed)
Smokes about 0.5 pack/day Used to smoke 1 pack/day for more than 40 years Wants to think about low-dose CT chest for now  Asked about quitting: confirms that he currently smokes cigarettes Advise to quit smoking: Educated about QUITTING to reduce the risk of cancer, cardio and cerebrovascular disease. Assess willingness: Unwilling to quit at this time, but is working on cutting back. Assist with counseling and pharmacotherapy: Counseled for 5 minutes and literature provided. Arrange for follow up: Follow up in 3 months and continue to offer help.

## 2023-03-31 NOTE — Assessment & Plan Note (Signed)
On Eliquis for ?LA thrombus Has normocytic anemia Denies active bleeding currently Check CBC

## 2023-03-31 NOTE — Assessment & Plan Note (Signed)
BP Readings from Last 1 Encounters:  03/31/23 97/61   Well-controlled with Losartan, Amlodipine, Spironolactone and Metoprolol Counseled for compliance with the medications Advised DASH diet and moderate exercise/walking, at least 150 mins/week

## 2023-03-31 NOTE — Assessment & Plan Note (Signed)
Last BMP reviewed Advised to maintain adequate hydration Avoid nephrotoxic agents On lisinopril and spironolactone Check CMP, PTH and Phosphorus

## 2023-04-02 ENCOUNTER — Telehealth: Payer: Self-pay | Admitting: Internal Medicine

## 2023-04-02 NOTE — Telephone Encounter (Signed)
lmtrc

## 2023-04-02 NOTE — Telephone Encounter (Signed)
Pt called in requesting a cll back

## 2023-05-05 ENCOUNTER — Ambulatory Visit: Payer: TRICARE For Life (TFL)

## 2023-05-13 ENCOUNTER — Other Ambulatory Visit: Payer: Self-pay | Admitting: Cardiology

## 2023-05-13 DIAGNOSIS — I10 Essential (primary) hypertension: Secondary | ICD-10-CM

## 2023-05-31 ENCOUNTER — Other Ambulatory Visit: Payer: Self-pay | Admitting: Cardiology

## 2023-05-31 DIAGNOSIS — I255 Ischemic cardiomyopathy: Secondary | ICD-10-CM

## 2023-06-16 ENCOUNTER — Ambulatory Visit (INDEPENDENT_AMBULATORY_CARE_PROVIDER_SITE_OTHER): Payer: Medicare Other

## 2023-06-16 DIAGNOSIS — Z23 Encounter for immunization: Secondary | ICD-10-CM | POA: Diagnosis not present

## 2023-06-21 ENCOUNTER — Other Ambulatory Visit: Payer: Self-pay | Admitting: Cardiology

## 2023-06-21 DIAGNOSIS — I10 Essential (primary) hypertension: Secondary | ICD-10-CM

## 2023-07-09 ENCOUNTER — Other Ambulatory Visit: Payer: Self-pay | Admitting: Cardiology

## 2023-08-04 ENCOUNTER — Other Ambulatory Visit: Payer: Self-pay | Admitting: Cardiology

## 2023-09-16 ENCOUNTER — Encounter: Payer: Self-pay | Admitting: Student

## 2023-09-16 ENCOUNTER — Ambulatory Visit: Payer: Medicare Other | Attending: Student | Admitting: Student

## 2023-09-16 VITALS — BP 108/70 | HR 64 | Ht 70.0 in | Wt 164.2 lb

## 2023-09-16 DIAGNOSIS — E785 Hyperlipidemia, unspecified: Secondary | ICD-10-CM | POA: Diagnosis present

## 2023-09-16 DIAGNOSIS — I10 Essential (primary) hypertension: Secondary | ICD-10-CM | POA: Insufficient documentation

## 2023-09-16 DIAGNOSIS — I513 Intracardiac thrombosis, not elsewhere classified: Secondary | ICD-10-CM | POA: Insufficient documentation

## 2023-09-16 DIAGNOSIS — I5022 Chronic systolic (congestive) heart failure: Secondary | ICD-10-CM | POA: Insufficient documentation

## 2023-09-16 DIAGNOSIS — N1831 Chronic kidney disease, stage 3a: Secondary | ICD-10-CM | POA: Insufficient documentation

## 2023-09-16 DIAGNOSIS — I2510 Atherosclerotic heart disease of native coronary artery without angina pectoris: Secondary | ICD-10-CM | POA: Diagnosis present

## 2023-09-16 MED ORDER — ROSUVASTATIN CALCIUM 40 MG PO TABS: 40.0000 mg | ORAL_TABLET | Freq: Every day | ORAL | 3 refills | Status: DC

## 2023-09-16 NOTE — Patient Instructions (Signed)
Medication Instructions:  Your physician recommends that you continue on your current medications as directed. Please refer to the Current Medication list given to you today.  Stop Taking Spironolactone and Lisinopril    *If you need a refill on your cardiac medications before your next appointment, please call your pharmacy*   Lab Work: NONE   If you have labs (blood work) drawn today and your tests are completely normal, you will receive your results only by: MyChart Message (if you have MyChart) OR A paper copy in the mail If you have any lab test that is abnormal or we need to change your treatment, we will call you to review the results.   Testing/Procedures: Your physician has requested that you have an echocardiogram. Echocardiography is a painless test that uses sound waves to create images of your heart. It provides your doctor with information about the size and shape of your heart and how well your heart's chambers and valves are working. This procedure takes approximately one hour. There are no restrictions for this procedure. Please do NOT wear cologne, perfume, aftershave, or lotions (deodorant is allowed). Please arrive 15 minutes prior to your appointment time.  Please note: We ask at that you not bring children with you during ultrasound (echo/ vascular) testing. Due to room size and safety concerns, children are not allowed in the ultrasound rooms during exams. Our front office staff cannot provide observation of children in our lobby area while testing is being conducted. An adult accompanying a patient to their appointment will only be allowed in the ultrasound room at the discretion of the ultrasound technician under special circumstances. We apologize for any inconvenience.    Follow-Up: At [facility], you and your health needs are our priority.  As part of our continuing mission to provide you with exceptional heart care, we have created designated  Provider Care Teams.  These Care Teams include your primary Cardiologist (physician) and Advanced Practice Providers (APPs -  Physician Assistants and Nurse Practitioners) who all work together to provide you with the care you need, when you need it.  We recommend signing up for the patient portal called "MyChart".  Sign up information is provided on this After Visit Summary.  MyChart is used to connect with patients for Virtual Visits (Telemedicine).  Patients are able to view lab/test results, encounter notes, upcoming appointments, etc.  Non-urgent messages can be sent to your provider as well.   To learn more about what you can do with MyChart, go to [email or web address].    Your next appointment:    October   Provider:   You may see Donato Schultz, MD or one of the following Advanced Practice Providers on your designated Care Team:   Randall An, PA-C  Jacolyn Reedy, PA-C     Other Instructions Thank you for choosing [facility]!

## 2023-09-16 NOTE — Progress Notes (Signed)
Cardiology Office Note    Date:  09/16/2023  ID:  [name], [name] 12-10-1950, MRN [id number] Cardiologist: Donato Schultz, MD    History of Present Illness:    James Ortega is a 73 y.o. 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   He was examined by Dr. Anne Fu in 05/2022 and denied any recent anginal symptoms at that time. No changes were made to his cardiac medications and he was continued on Amlodipine 5 mg daily, Eliquis 5 mg twice daily, Zetia 10 mg daily, Lisinopril 40 mg daily, Toprol-XL 50 mg daily and Crestor 40 mg daily. He did have a follow-up echocardiogram which showed his EF remained reduced at 40 to 45% and he was started on Spironolactone 12.5 mg daily.    In talking with the patient today, he reports that his wife did pass away last year after being critically ill for several years. He was her primary caregiver as well. Says that his appetite was poor during that timeframe but is starting to improve. He denies any specific chest pain or dyspnea on exertion. Still enjoys lifting weights routinely. No recent orthopnea, PND or pitting edema. He was previously having significant hypotension with associated dizziness on his current medical therapy and he did discontinue Spironolactone and Lisinopril in the interim. Remains on Amlodipine, Eliquis, Zetia, Toprol-XL and Crestor from a cardiac perspective.  Studies Reviewed:   EKG: EKG is ordered today and demonstrates:   EKG Interpretation Date/Time:  Thursday September 16 2023 15:21:16 EST Ventricular Rate:  64 PR Interval:  164 QRS Duration:  88 QT Interval:  420 QTC Calculation: 433 R Axis:   5  Text Interpretation: Normal sinus rhythm Low voltage QRS Inferior infarct pattern TWI along precordial leads actually improved as compared to prior  tracings. Confirmed by Randall An ([id number]) on 09/16/2023 8:06:50 PM       Echocardiogram: 05/2022 IMPRESSIONS     1. Left ventricular ejection fraction, by estimation, is 40-45%. The left  ventricle has mildly reduced function. The left ventricle demonstrates  aneurysmal apex. Recommend obtaining limited echo with contrast to  definitely rule out LV thrombus. There is   moderate left ventricular hypertrophy. Left ventricular diastolic  parameters are consistent with Grade I diastolic dysfunction (impaired  relaxation).   2. No significant valvular stenoses or regurgitation   3. The inferior vena cava is normal in size with greater than 50%  respiratory variability, suggesting right atrial pressure of 3 mmHg.   Physical Exam:   VS:  BP 108/70 (BP Location: Left Arm, Patient Position: Sitting, Cuff Size: Normal)   Pulse 64   Ht 5\' 10"  (1.778 m)   Wt 164 lb 3.2 oz (74.5 kg)   SpO2 99%   BMI 23.56 kg/m    Wt Readings from Last 3 Encounters:  09/16/23 164 lb 3.2 oz (74.5 kg)  03/31/23 163 lb 9.6 oz (74.2 kg)  09/30/22 167 lb 12.8 oz (76.1 kg)     GEN: Pleasant male appearing in no acute distress NECK: No JVD; No carotid bruits CARDIAC: RRR, no murmurs, rubs, gallops RESPIRATORY:  Clear to auscultation without rales, wheezing or rhonchi  ABDOMEN: Appears non-distended. No obvious abdominal masses. EXTREMITIES: No clubbing or cyanosis. No pitting edema.  Distal pedal pulses are 2+ bilaterally.  Assessment and Plan:   1. CAD - He underwent stenting to the LAD in 1999 in [place]. Most recent ischemic evaluation was an NST in 10/2010. He remains active at baseline and denies any recent anginal symptoms. Will plan for a follow-up echocardiogram later this year.   - He is not on ASA given the need for anticoagulation. Continue Crestor 40 mg daily, Zetia 10 mg daily and Toprol-XL 50 mg daily.  2. Chronic HFmrEF - EF was at 45 to 50% by echocardiogram in 2010 and at 40  to 45% by most recent imaging in 05/2022. He appears euvolemic by examination today and denies any recent respiratory issues. Will plan for a follow-up echocardiogram around 05/2024 for reassessment. He is currently on Toprol-XL 50 mg daily and previously stopped Lisinopril and Spironolactone due to hypotension. If his EF remains reduced by follow-up echocardiogram, would favor stopping Amlodipine and restarting Lisinopril or Spironolactone at a lower dose to optimize GDMT.  3. HTN - BP is at 108/70 during today's visit and he does report having recent hypotension as discussed above which led to discontinuation of Lisinopril and Spironolactone. For now, continue current medical therapy with Amlodipine 5 mg daily and Toprol-XL 50 mg daily. Pending echocardiogram results, may need to adjust his medications.  4. HLD - LDL was at 46 in 09/2022. Continue Crestor 40 mg daily and Zetia 10 mg daily..  5. History of LV Thrombus - He was informed by Cardiology in the past that he should remain on lifelong anticoagulation and has wanted to continue this. Remains on Eliquis 5mg  BID for anticoagulation which is the appropriate dose given his age, weight and renal function.   6. Stage 3 CKD - His creatinine was at 1.45 in 03/2023 which is close to his known baseline.  Signed, Ellsworth Lennox, PA-C

## 2023-10-04 ENCOUNTER — Ambulatory Visit (INDEPENDENT_AMBULATORY_CARE_PROVIDER_SITE_OTHER): Payer: Medicare Other | Admitting: Internal Medicine

## 2023-10-04 VITALS — BP 107/72 | HR 79 | Ht 70.5 in | Wt 168.2 lb

## 2023-10-04 DIAGNOSIS — N400 Enlarged prostate without lower urinary tract symptoms: Secondary | ICD-10-CM | POA: Insufficient documentation

## 2023-10-04 DIAGNOSIS — I2510 Atherosclerotic heart disease of native coronary artery without angina pectoris: Secondary | ICD-10-CM | POA: Diagnosis not present

## 2023-10-04 DIAGNOSIS — N1831 Chronic kidney disease, stage 3a: Secondary | ICD-10-CM | POA: Diagnosis not present

## 2023-10-04 DIAGNOSIS — Z7901 Long term (current) use of anticoagulants: Secondary | ICD-10-CM

## 2023-10-04 DIAGNOSIS — I255 Ischemic cardiomyopathy: Secondary | ICD-10-CM

## 2023-10-04 DIAGNOSIS — I10 Essential (primary) hypertension: Secondary | ICD-10-CM

## 2023-10-04 DIAGNOSIS — E782 Mixed hyperlipidemia: Secondary | ICD-10-CM

## 2023-10-04 DIAGNOSIS — F4321 Adjustment disorder with depressed mood: Secondary | ICD-10-CM | POA: Insufficient documentation

## 2023-10-04 DIAGNOSIS — R739 Hyperglycemia, unspecified: Secondary | ICD-10-CM

## 2023-10-04 NOTE — Assessment & Plan Note (Signed)
 Lost his wife in 2024 He is trying to get involved in spiritual reading Denies grief counseling

## 2023-10-04 NOTE — Assessment & Plan Note (Signed)
 Has nocturia Check PSA

## 2023-10-04 NOTE — Assessment & Plan Note (Addendum)
 BP Readings from Last 1 Encounters:  10/04/23 107/72   Well-controlled with Amlodipine  and Metoprolol  DCed lisinopril  and Spironolactone  due to hypotension Counseled for compliance with the medications Advised DASH diet and moderate exercise/walking, at least 150 mins/week

## 2023-10-04 NOTE — Progress Notes (Signed)
 Established Patient Office Visit  Subjective:  Patient ID: James Ortega, male    DOB: [birth year]  Age: 73 y.o. MRN: [id number]  CC:  Chief Complaint  Patient presents with   Hypertension   Coronary Artery Disease   Chronic Kidney Disease    HPI James Ortega is a 73 y.o. male with past medical history of CAD s/p stent placement, ischemic cardiomyopathy, HTN, CKD stage 3a, depression with anxiety and tobacco abuse who presents for f/u of his chronic medical conditions.  BP is well-controlled. Takes amlodipine  5 mg daily, lisinopril  40 mg daily and metoprolol  50 mg daily regularly. Patient denies headache, dizziness, chest pain, dyspnea or palpitations.   He was seen by cardiologist for history of CAD.  He had echo, which did not show any LA or LV thrombus.  He is still taking Eliquis , continued by his cardiologist.  Denies any bleeding issues currently.  He continues to smoke about 0.5 pack per day.   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 Low risk scan   US  ECHOCARDIOGRAPHY  01/14/2009   Moderate to severe apical wall hypokinesis, trace TR,MR, doppler suggestive of impaired LV relaxation    Family History  Problem Relation Age of Onset   Diabetes Mother    Diabetes Maternal Grandmother    Diabetes Maternal Grandfather     Social History   Socioeconomic History   Marital status: Married    Spouse name: Not on file   Number of children: Not on file   Years of education: Not on file    Highest education level: Associate degree: occupational, Scientist, product/process development, or vocational program  Occupational History   Not on file  Tobacco Use   Smoking status: Every Day    Current packs/day: 0.50    Types: Cigarettes   Smokeless tobacco: Never  Vaping Use   Vaping status: Never Used  Substance and Sexual Activity   Alcohol use: No    Alcohol/week: 0.0 standard drinks of alcohol   Drug use: No   Sexual activity: Yes  Other Topics Concern   Not on file  Social History Narrative   Not on file   Social Drivers of Health   Financial Resource Strain: Low Risk  (10/04/2023)   Overall Financial Resource Strain (CARDIA)    Difficulty of Paying Living Expenses: Not hard at all  Food Insecurity: No Food Insecurity (10/04/2023)   Hunger Vital Sign    Worried About Running Out of Food in the Last Year: Never true    Ran Out of Food in the Last Year: Never true  Transportation Needs: No Transportation Needs (10/04/2023)   PRAPARE - Administrator, Civil Service (Medical): No    Lack of Transportation (Non-Medical): No  Physical Activity: Sufficiently Active (10/04/2023)   Exercise Vital Sign    Days of Exercise per Week: 7 days    Minutes of Exercise per Session: 30 min  Stress: No Stress Concern Present (10/04/2023)  [employer] of Occupational Health - Occupational Stress Questionnaire    Feeling of Stress : Not at all  Social Connections: Unknown (10/04/2023)   Social Connection and Isolation Panel [NHANES]    Frequency of Communication with Friends and Family: Three times a week    Frequency of Social Gatherings with Friends and Family: Twice a week    Attends Religious Services: Patient declined    Database administrator or Organizations: Patient declined    Attends Banker Meetings: Not on file    Marital Status: Widowed  Catering manager Violence: Not on file    Outpatient Medications Prior to Visit  Medication Sig Dispense Refill   amLODipine   (NORVASC ) 5 MG tablet TAKE 1 TABLET DAILY 90 tablet 3   ELIQUIS  5 MG TABS tablet TAKE 1 TABLET TWICE A DAY 180 tablet 3   ezetimibe  (ZETIA ) 10 MG tablet TAKE 1 TABLET DAILY 90 tablet 3   fluticasone  (FLONASE ) 50 MCG/ACT nasal spray Place 2 sprays into both nostrils daily. 16 g 6   metoprolol  succinate (TOPROL -XL) 50 MG 24 hr tablet TAKE 1 TABLET DAILY WITH OR IMMEDIATELY FOLLOWING A MEAL 90 tablet 3   omeprazole  (PRILOSEC) 20 MG capsule Take 1 capsule (20 mg total) by mouth daily. 90 capsule 3   rosuvastatin  (CRESTOR ) 40 MG tablet Take 1 tablet (40 mg total) by mouth daily. 90 tablet 3   VITAMIN D PO Take 5,000 mg by mouth daily.     vitamin E 400 UNIT capsule Take 800 Units by mouth daily.     No facility-administered medications prior to visit.    No Known Allergies  ROS Review of Systems  Constitutional:  Negative for chills and fever.  HENT:  Negative for congestion and sore throat.   Eyes:  Negative for pain and discharge.  Respiratory:  Negative for cough and shortness of breath.   Cardiovascular:  Negative for chest pain and palpitations.  Gastrointestinal:  Negative for abdominal pain, nausea and vomiting.  Endocrine: Negative for polydipsia and polyuria.  Genitourinary:  Negative for dysuria and hematuria.  Musculoskeletal:  Negative for neck pain and neck stiffness.  Skin:  Negative for rash.  Neurological:  Negative for dizziness, weakness, numbness and headaches.  Psychiatric/Behavioral:  Positive for dysphoric mood. Negative for agitation and behavioral problems.       Objective:    Physical Exam Vitals reviewed.  Constitutional:      General: He is not in acute distress.    Appearance: He is not diaphoretic.  HENT:     Head: Normocephalic and atraumatic.     Nose: Nose normal.     Mouth/Throat:     Mouth: Mucous membranes are moist.  Eyes:     General: No scleral icterus.    Extraocular Movements: Extraocular movements intact.  Cardiovascular:     Rate  and Rhythm: Normal rate and regular rhythm.     Pulses: Normal pulses.     Heart sounds: Normal heart sounds. No murmur heard. Pulmonary:     Breath sounds: Normal breath sounds. No wheezing or rales.  Musculoskeletal:     Cervical back: Neck supple. No tenderness.     Right lower leg: No edema.     Left lower leg: No edema.  Skin:    General: Skin is warm.     Findings: No rash.  Neurological:     General: No focal deficit present.     Mental Status: He is alert and oriented to person,  place, and time.     Sensory: No sensory deficit.     Motor: No weakness.  Psychiatric:        Mood and Affect: Affect is flat.        Behavior: Behavior is cooperative.     BP 107/72   Pulse 79   Ht 5' 10.5" (1.791 m)   Wt 168 lb 3.2 oz (76.3 kg)   SpO2 97%   BMI 23.79 kg/m  Wt Readings from Last 3 Encounters:  10/04/23 168 lb 3.2 oz (76.3 kg)  09/16/23 164 lb 3.2 oz (74.5 kg)  03/31/23 163 lb 9.6 oz (74.2 kg)    No results found for: "TSH" Lab Results  Component Value Date   WBC 8.6 03/31/2023   HGB 12.4 (L) 03/31/2023   HCT 36.9 (L) 03/31/2023   MCV 97 03/31/2023   PLT 273 03/31/2023   Lab Results  Component Value Date   NA 142 03/31/2023   K 4.8 03/31/2023   CO2 25 03/31/2023   GLUCOSE 117 (H) 03/31/2023   BUN 20 03/31/2023   CREATININE 1.45 (H) 03/31/2023   BILITOT 0.4 03/31/2023   ALKPHOS 79 03/31/2023   AST 27 03/31/2023   ALT 21 03/31/2023   PROT 6.6 03/31/2023   ALBUMIN 4.3 03/31/2023   CALCIUM  10.1 03/31/2023   ANIONGAP 5 07/21/2022   EGFR 52 (L) 03/31/2023   Lab Results  Component Value Date   CHOL 104 09/30/2022   Lab Results  Component Value Date   HDL 40 09/30/2022   Lab Results  Component Value Date   LDLCALC 46 09/30/2022   Lab Results  Component Value Date   TRIG 92 09/30/2022   Lab Results  Component Value Date   CHOLHDL 2.6 09/30/2022   Lab Results  Component Value Date   HGBA1C 5.8 (H) 09/30/2022      Assessment & Plan:    Problem List Items Addressed This Visit       Cardiovascular and Mediastinum   Coronary artery disease (Chronic)   H/o MI (1999) treated with LAD stenting in [place] On B-blocker On Eliquis  for h/o LA thrombus, although last echo was negative for LA or LV thrombus Has seen Cardiology      Ischemic cardiomyopathy (Chronic)   Echo reviewed He states that he had LA thrombus. His previous PCP mentioned about it. No known h/o paroxysmal A Fib. Followed by cardiology - continue Eliquis  On statin ACEi and spironolactone  were recently discontinued due to hypotension Appears euvolemic currently      Essential hypertension - Primary (Chronic)   BP Readings from Last 1 Encounters:  10/04/23 107/72   Well-controlled with Amlodipine  and Metoprolol  DCed lisinopril  and Spironolactone  due to hypotension Counseled for compliance with the medications Advised DASH diet and moderate exercise/walking, at least 150 mins/week      Relevant Orders   CBC with Differential/Platelet   CMP14+EGFR   TSH     Genitourinary   Stage 3a chronic kidney disease (HCC)   Last BMP reviewed Advised to maintain adequate hydration Avoid nephrotoxic agents Was on lisinopril  and spironolactone , but had to DC due to hypotension Check CMP and CBC      Relevant Orders   CBC with Differential/Platelet   CMP14+EGFR   Benign prostatic hyperplasia   Has nocturia Check PSA      Relevant Orders   PSA     Other   Hyperlipidemia (Chronic)   On Crestor       Relevant Orders  Lipid Profile   Chronic anticoagulation   On Eliquis  for ?LA thrombus Has normocytic anemia Denies active bleeding currently Check CBC      Grief reaction   Lost his wife in 2024 He is trying to get involved in spiritual reading Denies grief counseling      Other Visit Diagnoses       Hyperglycemia       Relevant Orders   CMP14+EGFR   Hemoglobin A1c         No orders of the defined types were placed in this  encounter.    Follow-up: Return in about 6 months (around 04/02/2024) for HTN and CKD.    Meldon Sport, MD

## 2023-10-04 NOTE — Assessment & Plan Note (Addendum)
 Last BMP reviewed Advised to maintain adequate hydration Avoid nephrotoxic agents Was on lisinopril  and spironolactone , but had to DC due to hypotension Check CMP and CBC

## 2023-10-04 NOTE — Assessment & Plan Note (Signed)
On Eliquis for ?LA thrombus Has normocytic anemia Denies active bleeding currently Check CBC

## 2023-10-04 NOTE — Assessment & Plan Note (Signed)
H/o MI (1999) treated with LAD stenting in [place] On B-blocker On Eliquis for h/o LA thrombus, although last echo was negative for LA or LV thrombus Has seen Cardiology 

## 2023-10-04 NOTE — Assessment & Plan Note (Signed)
 On Crestor

## 2023-10-04 NOTE — Patient Instructions (Signed)
 Please continue to take medications as prescribed.  Please continue to follow low salt diet and perform moderate exercise/walking as tolerated.

## 2023-10-04 NOTE — Assessment & Plan Note (Signed)
 Echo reviewed He states that he had LA thrombus. His previous PCP mentioned about it. No known h/o paroxysmal A Fib. Followed by cardiology - continue Eliquis  On statin ACEi and spironolactone  were recently discontinued due to hypotension Appears euvolemic currently

## 2023-10-05 ENCOUNTER — Ambulatory Visit: Payer: Self-pay | Admitting: Internal Medicine

## 2023-10-05 LAB — CBC WITH DIFFERENTIAL/PLATELET
Basophils Absolute: 0.1 10*3/uL (ref 0.0–0.2)
Basos: 1 %
EOS (ABSOLUTE): 0.2 10*3/uL (ref 0.0–0.4)
Eos: 1 %
Hematocrit: 36.6 % — ABNORMAL LOW (ref 37.5–51.0)
Hemoglobin: 12.7 g/dL — ABNORMAL LOW (ref 13.0–17.7)
Immature Grans (Abs): 0 10*3/uL (ref 0.0–0.1)
Immature Granulocytes: 0 %
Lymphocytes Absolute: 1.9 10*3/uL (ref 0.7–3.1)
Lymphs: 16 %
MCH: 33.4 pg — ABNORMAL HIGH (ref 26.6–33.0)
MCHC: 34.7 g/dL (ref 31.5–35.7)
MCV: 96 fL (ref 79–97)
Monocytes Absolute: 0.8 10*3/uL (ref 0.1–0.9)
Monocytes: 7 %
Neutrophils Absolute: 8.5 10*3/uL — ABNORMAL HIGH (ref 1.4–7.0)
Neutrophils: 75 %
Platelets: 256 10*3/uL (ref 150–450)
RBC: 3.8 x10E6/uL — ABNORMAL LOW (ref 4.14–5.80)
RDW: 13.6 % (ref 11.6–15.4)
WBC: 11.4 10*3/uL — ABNORMAL HIGH (ref 3.4–10.8)

## 2023-10-05 LAB — CMP14+EGFR
ALT: 25 [IU]/L (ref 0–44)
AST: 30 [IU]/L (ref 0–40)
Albumin: 4.3 g/dL (ref 3.8–4.8)
Alkaline Phosphatase: 78 [IU]/L (ref 44–121)
BUN/Creatinine Ratio: 12 (ref 10–24)
BUN: 20 mg/dL (ref 8–27)
Bilirubin Total: 0.4 mg/dL (ref 0.0–1.2)
CO2: 26 mmol/L (ref 20–29)
Calcium: 9.6 mg/dL (ref 8.6–10.2)
Chloride: 104 mmol/L (ref 96–106)
Creatinine, Ser: 1.67 mg/dL — ABNORMAL HIGH (ref 0.76–1.27)
Globulin, Total: 1.9 g/dL (ref 1.5–4.5)
Glucose: 119 mg/dL — ABNORMAL HIGH (ref 70–99)
Potassium: 4.3 mmol/L (ref 3.5–5.2)
Sodium: 141 mmol/L (ref 134–144)
Total Protein: 6.2 g/dL (ref 6.0–8.5)
eGFR: 43 mL/min/{1.73_m2} — ABNORMAL LOW (ref >59)

## 2023-10-05 LAB — PSA: Prostate Specific Ag, Serum: 2.2 ng/mL (ref 0.0–4.0)

## 2023-10-05 LAB — LIPID PANEL
Chol/HDL Ratio: 2.5 {ratio} (ref 0.0–5.0)
Cholesterol, Total: 96 mg/dL — ABNORMAL LOW (ref 100–199)
HDL: 38 mg/dL — ABNORMAL LOW (ref >39)
LDL Chol Calc (NIH): 40 mg/dL (ref 0–99)
Triglycerides: 92 mg/dL (ref 0–149)
VLDL Cholesterol Cal: 18 mg/dL (ref 5–40)

## 2023-10-05 LAB — TSH: TSH: 1.54 u[IU]/mL (ref 0.450–4.500)

## 2023-10-05 LAB — HEMOGLOBIN A1C
Est. average glucose Bld gHb Est-mCnc: 123 mg/dL
Hgb A1c MFr Bld: 5.9 % — ABNORMAL HIGH (ref 4.8–5.6)

## 2023-10-05 NOTE — Telephone Encounter (Signed)
Pt returning a call from CMA Sheena at the office who left a message for the patient in regards to labwork. RN called the CAL to ensure CAL or Sheena did not need to speak to patient directly, they said they did not. This RN communicated to pt the message from the provider about pt's kidney function with the instruction that he should drink 64oz fluid per day. Pt verbalized understanding but would also appreciate more follow-up about what else he can do. States his wife passed 5 months ago and had kidney disease so he is concerned.  Copied from CRM [id number]. Topic: Clinical - Lab/Test Results >> Oct 05, 2023  3:18 PM Martha Clan wrote: Reason for CRM: Got labs released today, wants latest results. Several abnormal. Answer Assessment - Initial Assessment Questions 1. REASON FOR CALL or QUESTION: "What is your reason for calling today?" or "How can I best help you?" or "What question do you have that I can help answer?"     Pt calls today for information about his lab results. States Sheena from [facility] called him and left a voicemail about his labs. This RN called the CAL to ensure that Sheena/the office did not need to speak to pt directly. CAL said no they did not. This RN communicated to pt the doctor's notes about the abnormal lab. Pt verbalized understanding about the labs and verbalized understanding about the instruction to drink 64oz fluid per day to assist in kidney function. Pt would appreciate more follow-up about what else he can do. Pt's wife passed away 5 months ago and had kidney disease so pt is concerned.  Protocols used: Information Only Call - No Triage-A-AH

## 2023-10-05 NOTE — Telephone Encounter (Signed)
Reason for Disposition . Health Information question, no triage required and triager able to answer question  Protocols used: Information Only Call - No Triage-A-AH

## 2023-10-08 ENCOUNTER — Other Ambulatory Visit: Payer: Self-pay

## 2023-10-08 MED ORDER — LISINOPRIL 2.5 MG PO TABS: 2.5000 mg | ORAL_TABLET | Freq: Every day | ORAL | 3 refills | Status: DC

## 2023-12-01 ENCOUNTER — Telehealth: Payer: Self-pay | Admitting: Cardiology

## 2023-12-01 DIAGNOSIS — I255 Ischemic cardiomyopathy: Secondary | ICD-10-CM

## 2023-12-01 MED ORDER — APIXABAN 5 MG PO TABS: 5.0000 mg | ORAL_TABLET | Freq: Two times a day (BID) | ORAL | 3 refills | Status: DC

## 2023-12-01 NOTE — Telephone Encounter (Signed)
 Prescription refill request for Eliquis received. Indication: LV Thrombus Last office visit: 09/16/23  B Strader PA-C Scr: 1.67 on 10/04/23  Epic Age: 73 Weight: 74.5kg  Based on above findings Eliquis 5mg  twice daily is the appropriate dose.  Refill approved.

## 2023-12-01 NOTE — Telephone Encounter (Signed)
 Pt c/o medication issue:  1. Name of Medication: ELIQUIS 5 MG TABS tablet   2. How are you currently taking this medication (dosage and times per day)? TAKE 1 TABLET TWICE A DAY   3. Are you having a reaction (difficulty breathing--STAT)? No  4. What is your medication issue? Patient is calling because he saw PA [place] on 09/16/23. Patient stated that when he was at his appointment he requested for [place] to send a prescription with 180 tablets for 3 refills instead of the 60 tablets with 3 refills due to the price of the medication. Patient stated when he received the refill from the pharmacy it was still for the 60 tablets instead of the 180 tablets. Patient would like [place] to update the prescription. Patient requested if we give him a call to leave a detailed VM due to him recently losing his wife. Patient would like alone time while he is grieving. Please advise.

## 2024-04-03 ENCOUNTER — Ambulatory Visit (INDEPENDENT_AMBULATORY_CARE_PROVIDER_SITE_OTHER): Payer: TRICARE For Life (TFL) | Admitting: Internal Medicine

## 2024-04-03 ENCOUNTER — Encounter: Payer: Self-pay | Admitting: Internal Medicine

## 2024-04-03 VITALS — BP 95/61 | HR 72 | Ht 70.5 in | Wt 165.4 lb

## 2024-04-03 DIAGNOSIS — N1831 Chronic kidney disease, stage 3a: Secondary | ICD-10-CM

## 2024-04-03 DIAGNOSIS — N401 Enlarged prostate with lower urinary tract symptoms: Secondary | ICD-10-CM | POA: Diagnosis not present

## 2024-04-03 DIAGNOSIS — L819 Disorder of pigmentation, unspecified: Secondary | ICD-10-CM | POA: Diagnosis not present

## 2024-04-03 DIAGNOSIS — I10 Essential (primary) hypertension: Secondary | ICD-10-CM

## 2024-04-03 DIAGNOSIS — R351 Nocturia: Secondary | ICD-10-CM

## 2024-04-03 NOTE — Assessment & Plan Note (Addendum)
 BP Readings from Last 1 Encounters:  04/03/24 95/61   Well-controlled with Amlodipine  5 mg QD, lisinopril  2.5 mg QD and Metoprolol  50 mg QD DCed amlodipine  due to borderline hypotension Counseled for compliance with the medications Advised DASH diet and moderate exercise/walking as tolerated

## 2024-04-03 NOTE — Progress Notes (Signed)
 Established Patient Office Visit  Subjective:  Patient ID: James Ortega, male    DOB: May 23, 1951  Age: 73 y.o. MRN: [id number]  CC:  Chief Complaint  Patient presents with   Medical Management of Chronic Issues    6 MONTH F/U    Skin Problem    Has skin concerns on left arm.     HPI James Ortega is a 73 y.o. male with past medical history of CAD s/p stent placement, ischemic cardiomyopathy, HTN, CKD stage 3a, depression with anxiety and tobacco abuse who presents for f/u of his chronic medical conditions.  BP is low normal. Takes amlodipine  5 mg daily, lisinopril  2.5 mg daily and metoprolol  50 mg daily regularly. Patient denies headache, dizziness, chest pain, dyspnea or palpitations.  He was seen by cardiologist for history of CAD.  He had echo, which did not show any LA or LV thrombus.  He is still taking Eliquis , continued by his cardiologist.  Denies any bleeding issues currently.  He reports urinary hesitancy, frequency and nocturia, which is chronic.  He also reports postvoid dribbling.  Denies dysuria or hematuria.  He reports a skin lesion over left forearm, which is present for the last few months, white discoloration (depigmentation).  Denies any itching.  He continues to smoke about 0.5 pack per day.   Past Medical History:  Diagnosis Date   History of acute anterior wall MI    Hyperlipidemia    Hypertension     Past Surgical History:  Procedure Laterality Date   AMPUTATION Left 03/24/2016   Procedure: REVISION AMPUTATION LEFT INDEX FINGER;  Surgeon: Elsie Mussel, MD;  Location: [facility] OR;  Service: Orthopedics;  Laterality: Left;   CORONARY ANGIOPLASTY WITH STENT PLACEMENT  05/1998   bare metal stent [facility] [place]    NM MYOCAR PERF WALL MOTION  11/13/2010   No significant ischemia demonstrated. Low risk scan   US  ECHOCARDIOGRAPHY  01/14/2009   Moderate to severe apical wall hypokinesis, trace TR,MR, doppler suggestive of impaired LV  relaxation    Family History  Problem Relation Age of Onset   Diabetes Mother    Diabetes Maternal Grandmother    Diabetes Maternal Grandfather     Social History   Socioeconomic History   Marital status: Married    Spouse name: Not on file   Number of children: Not on file   Years of education: Not on file   Highest education level: Associate degree: occupational, Scientist, product/process development, or vocational program  Occupational History   Not on file  Tobacco Use   Smoking status: Every Day    Current packs/day: 0.50    Types: Cigarettes   Smokeless tobacco: Never  Vaping Use   Vaping status: Never Used  Substance and Sexual Activity   Alcohol use: No    Alcohol/week: 0.0 standard drinks of alcohol   Drug use: No   Sexual activity: Yes  Other Topics Concern   Not on file  Social History Narrative   Not on file   Social Drivers of Health   Financial Resource Strain: Low Risk  (10/04/2023)   Overall Financial Resource Strain (CARDIA)    Difficulty of Paying Living Expenses: Not hard at all  Food Insecurity: No Food Insecurity (10/04/2023)   Hunger Vital Sign    Worried About Running Out of Food in the Last Year: Never true    Ran Out of Food in the Last Year: Never true  Transportation Needs: No Transportation Needs (  Minutes of Exercise per Session: 30 min  Stress: No Stress Concern Present (10/04/2023)   [employer] of Occupational Health - Occupational Stress Questionnaire    Feeling of Stress : Not at all  Social Connections: Unknown (10/04/2023)   Social Connection and Isolation Panel    Frequency of Communication with Friends and Family: Three times a week    Frequency of Social Gatherings with Friends and Family: Twice a week    Attends Religious Services: Patient declined    Database administrator or Organizations: Patient declined    Attends Banker Meetings: Not on file    Marital Status: Widowed  Catering manager Violence: Not on file    Outpatient Medications Prior to Visit  Medication Sig  Dispense Refill   amLODipine  (NORVASC ) 5 MG tablet TAKE 1 TABLET DAILY 90 tablet 3   apixaban  (ELIQUIS ) 5 MG TABS tablet Take 1 tablet (5 mg total) by mouth 2 (two) times daily. 180 tablet 3   ezetimibe  (ZETIA ) 10 MG tablet TAKE 1 TABLET DAILY 90 tablet 3   fluticasone  (FLONASE ) 50 MCG/ACT nasal spray Place 2 sprays into both nostrils daily. 16 g 6   lisinopril  (ZESTRIL ) 2.5 MG tablet Take 1 tablet (2.5 mg total) by mouth daily. 90 tablet 3   metoprolol  succinate (TOPROL -XL) 50 MG 24 hr tablet TAKE 1 TABLET DAILY WITH OR IMMEDIATELY FOLLOWING A MEAL 90 tablet 3   omeprazole  (PRILOSEC) 20 MG capsule Take 1 capsule (20 mg total) by mouth daily. 90 capsule 3   rosuvastatin  (CRESTOR ) 40 MG tablet Take 1 tablet (40 mg total) by mouth daily. 90 tablet 3   VITAMIN D PO Take 5,000 mg by mouth daily.     vitamin E 400 UNIT capsule Take 800 Units by mouth daily.     No facility-administered medications prior to visit.    No Known Allergies  ROS Review of Systems  Constitutional:  Negative for chills and fever.  HENT:  Negative for congestion and sore throat.   Eyes:  Negative for pain and discharge.  Respiratory:  Negative for cough and shortness of breath.   Cardiovascular:  Negative for chest pain and palpitations.  Gastrointestinal:  Negative for abdominal pain, nausea and vomiting.  Endocrine: Negative for polydipsia and polyuria.  Genitourinary:  Negative for dysuria and hematuria.  Musculoskeletal:  Negative for neck pain and neck stiffness.  Skin:  Negative for rash.  Neurological:  Negative for dizziness, weakness, numbness and headaches.  Psychiatric/Behavioral:  Positive for dysphoric mood. Negative for agitation and behavioral problems.       Objective:    Physical Exam Vitals reviewed.  Constitutional:      General: He is not in acute distress.    Appearance: He is not diaphoretic.  HENT:     Head: Normocephalic and atraumatic.     Nose: Nose normal.     Mouth/Throat:      Mouth: Mucous membranes are moist.  Eyes:     General: No scleral icterus.    Extraocular Movements: Extraocular movements intact.  Cardiovascular:     Rate and Rhythm: Normal rate and regular rhythm.     Pulses: Normal pulses.     Heart sounds: Normal heart sounds. No murmur heard. Pulmonary:     Breath sounds: Normal breath sounds. No wheezing or rales.  Musculoskeletal:     Cervical back: Neck supple. No tenderness.     Right lower leg: No edema.     Left lower leg: No  edema.  Skin:    General: Skin is warm.     Findings: No rash.  Neurological:     General: No focal deficit present.     Mental Status: He is alert and oriented to person, place, and time.     Sensory: No sensory deficit.     Motor: No weakness.  Psychiatric:        Mood and Affect: Affect is flat.        Behavior: Behavior is cooperative.     BP 95/61   Pulse 72   Ht 5' 10.5 (1.791 m)   Wt 165 lb 6.4 oz (75 kg)   SpO2 97%   BMI 23.40 kg/m  Wt Readings from Last 3 Encounters:  04/03/24 165 lb 6.4 oz (75 kg)  10/04/23 168 lb 3.2 oz (76.3 kg)  09/16/23 164 lb 3.2 oz (74.5 kg)    Lab Results  Component Value Date   TSH 1.540 10/04/2023   Lab Results  Component Value Date   WBC 11.4 (H) 10/04/2023   HGB 12.7 (L) 10/04/2023   HCT 36.6 (L) 10/04/2023   MCV 96 10/04/2023   PLT 256 10/04/2023   Lab Results  Component Value Date   NA 141 10/04/2023   K 4.3 10/04/2023   CO2 26 10/04/2023   GLUCOSE 119 (H) 10/04/2023   BUN 20 10/04/2023   CREATININE 1.67 (H) 10/04/2023   BILITOT 0.4 10/04/2023   ALKPHOS 78 10/04/2023   AST 30 10/04/2023   ALT 25 10/04/2023   PROT 6.2 10/04/2023   ALBUMIN 4.3 10/04/2023   CALCIUM  9.6 10/04/2023   ANIONGAP 5 07/21/2022   EGFR 43 (L) 10/04/2023   Lab Results  Component Value Date   CHOL 96 (L) 10/04/2023   Lab Results  Component Value Date   HDL 38 (L) 10/04/2023   Lab Results  Component Value Date   LDLCALC 40 10/04/2023   Lab Results   Component Value Date   TRIG 92 10/04/2023   Lab Results  Component Value Date   CHOLHDL 2.5 10/04/2023   Lab Results  Component Value Date   HGBA1C 5.9 (H) 10/04/2023      Assessment & Plan:   Problem List Items Addressed This Visit       Genitourinary   Benign prostatic hyperplasia - Primary   Has nocturia Checked PSA Started Flomax  0.4 mg QD         No orders of the defined types were placed in this encounter.    Follow-up: No follow-ups on file.    Suzzane MARLA Blanch, MD  Relevant Medications   tamsulosin  (FLOMAX ) 0.4 MG CAPS capsule     Other   Hypopigmentation   Skin lesion over left forearm likely hypopigmented spot/vitiligo Reassured it being benign         Meds ordered this encounter  Medications   tamsulosin  (FLOMAX ) 0.4 MG CAPS capsule    Sig: Take 1 capsule (0.4 mg total) by mouth daily.    Dispense:  90 capsule    Refill:  1     Follow-up: Return in  about 6 months (around 10/04/2024) for HTN and BPH.    Suzzane MARLA Blanch, MD

## 2024-04-03 NOTE — Patient Instructions (Signed)
 Please stop taking Amlodipine .  Please start taking Tamsulosin  as prescribed for urinary symptoms.  Please continue to take medications as prescribed.  Please continue to follow low salt diet and ambulate as tolerated.

## 2024-04-03 NOTE — Assessment & Plan Note (Signed)
 Has nocturia Checked PSA Started Flomax  0.4 mg QD

## 2024-04-04 ENCOUNTER — Ambulatory Visit: Payer: Self-pay | Admitting: Internal Medicine

## 2024-04-04 LAB — CBC WITH DIFFERENTIAL/PLATELET
Basophils Absolute: 0.1 x10E3/uL (ref 0.0–0.2)
Basos: 1 %
EOS (ABSOLUTE): 0.1 x10E3/uL (ref 0.0–0.4)
Eos: 1 %
Hematocrit: 38.5 % (ref 37.5–51.0)
Hemoglobin: 12.7 g/dL — ABNORMAL LOW (ref 13.0–17.7)
Immature Grans (Abs): 0 x10E3/uL (ref 0.0–0.1)
Immature Granulocytes: 0 %
Lymphocytes Absolute: 1.8 x10E3/uL (ref 0.7–3.1)
Lymphs: 17 %
MCH: 32.5 pg (ref 26.6–33.0)
MCHC: 33 g/dL (ref 31.5–35.7)
MCV: 99 fL — ABNORMAL HIGH (ref 79–97)
Monocytes Absolute: 0.8 x10E3/uL (ref 0.1–0.9)
Monocytes: 8 %
Neutrophils Absolute: 8.1 x10E3/uL — ABNORMAL HIGH (ref 1.4–7.0)
Neutrophils: 73 %
Platelets: 236 x10E3/uL (ref 150–450)
RBC: 3.91 x10E6/uL — ABNORMAL LOW (ref 4.14–5.80)
RDW: 13.8 % (ref 11.6–15.4)
WBC: 11 x10E3/uL — ABNORMAL HIGH (ref 3.4–10.8)

## 2024-04-04 LAB — BASIC METABOLIC PANEL WITH GFR
BUN/Creatinine Ratio: 13 (ref 10–24)
BUN: 21 mg/dL (ref 8–27)
CO2: 21 mmol/L (ref 20–29)
Calcium: 9.6 mg/dL (ref 8.6–10.2)
Chloride: 103 mmol/L (ref 96–106)
Creatinine, Ser: 1.68 mg/dL — ABNORMAL HIGH (ref 0.76–1.27)
Glucose: 109 mg/dL — ABNORMAL HIGH (ref 70–99)
Potassium: 4.1 mmol/L (ref 3.5–5.2)
Sodium: 140 mmol/L (ref 134–144)
eGFR: 43 mL/min/1.73 — ABNORMAL LOW (ref >59)

## 2024-04-04 MED ORDER — TAMSULOSIN HCL 0.4 MG PO CAPS: 0.4000 mg | ORAL_CAPSULE | Freq: Every day | ORAL | 1 refills | Status: DC

## 2024-04-04 NOTE — Assessment & Plan Note (Addendum)
 Skin lesion over left forearm likely hypopigmented spot/vitiligo Reassured it being benign

## 2024-04-04 NOTE — Assessment & Plan Note (Addendum)
 Last BMP reviewed Advised to maintain adequate hydration Avoid nephrotoxic agents Was on lisinopril  and spironolactone , but had to DC due to hypotension Check BMP and CBC

## 2024-04-06 ENCOUNTER — Telehealth: Payer: Self-pay

## 2024-04-06 NOTE — Telephone Encounter (Signed)
 Copied from CRM [id number]. Topic: Clinical - Lab/Test Results >> Apr 05, 2024  4:41 PM Carlyon D wrote: Reason for CRM: Pt calling in regards to missed call he had from [place] about pt lab results. Reached out to CAL was placed on hold for over 5 min, Pt states he would just like a call back again., please reach out to pt in regards to results

## 2024-04-06 NOTE — Telephone Encounter (Signed)
Attempted to contact pt unable to reach him.

## 2024-05-03 ENCOUNTER — Other Ambulatory Visit: Payer: Self-pay | Admitting: Cardiology

## 2024-05-08 ENCOUNTER — Other Ambulatory Visit: Payer: Self-pay | Admitting: Cardiology

## 2024-05-08 DIAGNOSIS — I10 Essential (primary) hypertension: Secondary | ICD-10-CM

## 2024-05-15 ENCOUNTER — Other Ambulatory Visit: Payer: Self-pay | Admitting: Cardiology

## 2024-05-15 DIAGNOSIS — I255 Ischemic cardiomyopathy: Secondary | ICD-10-CM

## 2024-05-15 NOTE — Telephone Encounter (Signed)
 Prescription refill request for Eliquis  received. Indication:LV THROMBUS Last office visit:1/25 Scr:1.68  8/25 Age: 73 Weight:75  kg  Prescription refilled

## 2024-05-23 ENCOUNTER — Ambulatory Visit: Payer: Self-pay

## 2024-05-23 DIAGNOSIS — Z23 Encounter for immunization: Secondary | ICD-10-CM

## 2024-05-23 NOTE — Progress Notes (Signed)
 Patient is in office today for a nurse visit for FLU SHOT. Patient Injection was given in the  Left deltoid. Patient tolerated injection well.

## 2024-06-05 ENCOUNTER — Other Ambulatory Visit: Payer: Self-pay | Admitting: Internal Medicine

## 2024-06-05 DIAGNOSIS — K219 Gastro-esophageal reflux disease without esophagitis: Secondary | ICD-10-CM

## 2024-06-12 ENCOUNTER — Ambulatory Visit (HOSPITAL_COMMUNITY)
Admission: RE | Admit: 2024-06-12 | Discharge: 2024-06-12 | Disposition: A | Discharge status: Home / Self Care | Payer: TRICARE For Life (TFL) | Source: Ambulatory Visit | Attending: Internal Medicine | Admitting: Internal Medicine

## 2024-06-12 DIAGNOSIS — I10 Essential (primary) hypertension: Secondary | ICD-10-CM | POA: Insufficient documentation

## 2024-06-12 DIAGNOSIS — I5022 Chronic systolic (congestive) heart failure: Secondary | ICD-10-CM | POA: Diagnosis not present

## 2024-06-12 LAB — ECHOCARDIOGRAM COMPLETE
Area-P 1/2: 2.8 cm2
S' Lateral: 3.2 cm

## 2024-06-12 MED ORDER — PERFLUTREN LIPID MICROSPHERE
1.0000 mL | INTRAVENOUS | Status: AC | PRN
  Administered 2024-06-12: 3 mL via INTRAVENOUS

## 2024-06-12 NOTE — Progress Notes (Signed)
*  PRELIMINARY RESULTS* Echocardiogram 2D Echocardiogram has been performed with Definity.  James Ortega 06/12/2024, 4:21 PM

## 2024-06-13 ENCOUNTER — Other Ambulatory Visit: Payer: Self-pay | Admitting: Cardiology

## 2024-06-13 ENCOUNTER — Ambulatory Visit: Payer: Self-pay | Admitting: Student

## 2024-06-13 DIAGNOSIS — I10 Essential (primary) hypertension: Secondary | ICD-10-CM

## 2024-06-28 ENCOUNTER — Telehealth: Payer: Self-pay | Admitting: Cardiology

## 2024-06-28 NOTE — Telephone Encounter (Signed)
 Patient is calling to see if the appt on 11/19 is needed. Also, talk about the changes to th his meds. Please advise

## 2024-06-29 NOTE — Telephone Encounter (Signed)
 Messaged provider through [employer]. Pt cx 11/10 appt and r/s for 08/2024.

## 2024-07-12 ENCOUNTER — Ambulatory Visit: Admitting: Student

## 2024-07-27 ENCOUNTER — Other Ambulatory Visit: Payer: Self-pay

## 2024-07-27 MED ORDER — EZETIMIBE 10 MG PO TABS: 10.0000 mg | ORAL_TABLET | Freq: Every day | ORAL | 3 refills | Status: DC

## 2024-07-27 MED ORDER — ROSUVASTATIN CALCIUM 40 MG PO TABS: 40.0000 mg | ORAL_TABLET | Freq: Every day | ORAL | 3 refills | Status: AC

## 2024-07-31 ENCOUNTER — Other Ambulatory Visit: Payer: Self-pay | Admitting: Student

## 2024-07-31 DIAGNOSIS — I10 Essential (primary) hypertension: Secondary | ICD-10-CM

## 2024-07-31 MED ORDER — AMLODIPINE BESYLATE 5 MG PO TABS: 5.0000 mg | ORAL_TABLET | Freq: Every day | ORAL | 3 refills | Status: AC

## 2024-08-01 ENCOUNTER — Other Ambulatory Visit: Payer: Self-pay | Admitting: Student

## 2024-09-03 NOTE — Progress Notes (Unsigned)
 "  Cardiology Office Note    Date:  09/06/2024  ID:  James Ortega, DOB Mar 10, 1951, MRN [id number] Cardiologist: Oneil Parchment, MD { :  History of Present Illness:    James Ortega is a 74 y.o. 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  He was last examined by myself in 08/2023 and was lifting weights routinely and denied any specific anginal symptoms. He was previously having hypotension with associated dizziness and had discontinued Spironolactone  and Lisinopril  with improvement in symptoms. He was still taking Toprol -XL 50 mg daily and it was recommended that if his EF remained reduced by echocardiogram, would favor stopping Amlodipine  and restarting Lisinopril  or Spironolactone  to optimize GDMT. Repeat echocardiogram was obtained in 05/2024 and showed his EF had improved to 50 to 55% and he did have wall motion abnormalities along the apex which had been noted on prior imaging. Was continued on his current cardiac medications.  In talking with the patient today, he reports doing well from a cardiac perspective and denies any recent exertional chest pain or dyspnea on exertion. He does lift weights and occasionally gets a pain that is reproducible with palpation and says this feels like a musculoskeletal discomfort. No recent orthopnea, PND or pitting edema. He is still adjusting to changes after his wife passed away a little over a year ago as he was her primary caregiver for several years.  Studies Reviewed:   EKG: EKG is ordered today and demonstrates:   EKG Interpretation Date/Time:  Wednesday September 06 2024 15:26:15 EST Ventricular Rate:  57 PR Interval:  174 QRS Duration:  96 QT Interval:  422 QTC Calculation: 410 R Axis:   -51  Text Interpretation: Sinus bradycardia Low voltage QRS Left anterior  fascicular block TWI along V4-V6 as noted on prior tracings. Reconfirmed by Johnson Grate ([id number]) on 09/06/2024 4:46:45 PM       Echocardiogram: 06/12/2024 IMPRESSIONS     1. 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 Left ventricular ejection fraction, by  estimation, is 50 to 55%. Left ventricular ejection fraction by 3D volume  is 52 %. The left ventricle has low normal  function. 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 Left ventricular diastolic parameters  are consistent with Grade I diastolic dysfunction (impaired relaxation).   2. Right ventricular systolic function is mildly reduced. The right  ventricular size is moderately enlarged. Tricuspid regurgitation signal is  inadequate for assessing PA pressure.   3. The mitral valve is normal in structure. No evidence of mitral valve  regurgitation. No evidence of mitral stenosis.   4. The aortic valve is tricuspid. Aortic valve regurgitation is not  visualized. No aortic stenosis is present.   5. The inferior vena cava is normal in size with greater than 50%  respiratory variability, suggesting right atrial pressure of 3 mmHg.    Physical Exam:   VS:  BP 100/70 (BP Location: Left Arm, Cuff Size: Normal)   Pulse (!) 59   Ht 5' 10.5 (1.791 m)   Wt 164 lb 3.2 oz (74.5 kg)   SpO2 98%   BMI 23.23 kg/m    Wt Readings from Last 3 Encounters:  09/06/24 164 lb 3.2 oz (74.5 kg)  04/03/24 165 lb 6.4 oz (75 kg)  10/04/23 168 lb 3.2 oz (76.3 kg)     GEN: Well nourished, well developed male appearing in no acute distress NECK: No JVD; No carotid bruits CARDIAC: RRR, no murmurs, rubs, gallops RESPIRATORY:  Clear to auscultation without rales, wheezing or rhonchi  ABDOMEN: Appears non-distended. No obvious abdominal masses. EXTREMITIES: No clubbing or cyanosis. No pitting edema.  Distal pedal pulses are 2+  bilaterally.   Assessment and Plan:   1. Coronary artery disease involving native coronary artery of native heart without angina pectoris - He previously underwent stenting to the LAD in 1999 and did have a low-risk NST in 2012. He remains active at baseline and denies any recent anginal symptoms.  - He is not on ASA given the need for anticoagulation. Continue Toprol -XL 50 mg daily and Crestor  40 mg daily.  2. Chronic heart failure with mildly reduced ejection fraction (HFmrEF, 41-49%) (HCC) - His EF was previously 40 to 45% in 05/2022 and had improved to 50 to 55% by most recent imaging in 05/2024.  - He appears euvolemic by examination today and denies any recent respiratory issues. Continue Toprol -XL 50 mg daily. Previously stopped Lisinopril  and Spironolactone  due to hypotension. Could consider an SGLT2i in the future if he had fluid issues but would not start at this time given prior issues with orthostatic dizziness.  3. Left ventricular thrombus - Was previously recommended to continue lifelong anticoagulation. He has remained on Eliquis  5 mg twice daily which is the correct dose given his current age (74 years old), weight (164 lbs) and renal function (creatinine at 1.68 when checked in 03/2024).  No reports of active bleeding. CBC in 03/2024 showed his hemoglobin was stable at 12.7 with platelets at 236K .  4. Essential hypertension - BP is soft at 100/70 during today's visit but he denies any associated symptoms. We reviewed that he could try reducing Amlodipine  from 5 mg daily to 2.5 mg daily and follow readings with this to see how BP responds. Continue Toprol -XL 50 mg daily.  5. Hyperlipidemia LDL goal <70 - FLP in 09/2023 showed total cholesterol 96, triglycerides 92, HDL 38 and LDL 40. Continue current medical therapy with Crestor  40 mg daily.  Signed, Laymon CHRISTELLA Qua, PA-C   "

## 2024-09-06 ENCOUNTER — Encounter: Payer: Self-pay | Admitting: Student

## 2024-09-06 ENCOUNTER — Ambulatory Visit: Attending: Student | Admitting: Student

## 2024-09-06 VITALS — BP 100/70 | HR 59 | Ht 70.5 in | Wt 164.2 lb

## 2024-09-06 DIAGNOSIS — I5022 Chronic systolic (congestive) heart failure: Secondary | ICD-10-CM | POA: Diagnosis not present

## 2024-09-06 DIAGNOSIS — I10 Essential (primary) hypertension: Secondary | ICD-10-CM | POA: Diagnosis not present

## 2024-09-06 DIAGNOSIS — I513 Intracardiac thrombosis, not elsewhere classified: Secondary | ICD-10-CM | POA: Insufficient documentation

## 2024-09-06 DIAGNOSIS — E785 Hyperlipidemia, unspecified: Secondary | ICD-10-CM | POA: Diagnosis not present

## 2024-09-06 DIAGNOSIS — I2510 Atherosclerotic heart disease of native coronary artery without angina pectoris: Secondary | ICD-10-CM | POA: Diagnosis not present

## 2024-09-06 NOTE — Patient Instructions (Addendum)
 Try reducing Amlodipine  (Norvasc ) to 2.5mg  daily to see how blood pressure responds.   Medication Instructions:  Your physician recommends that you continue on your current medications as directed. Please refer to the Current Medication list given to you today.  *If you need a refill on your cardiac medications before your next appointment, please call your pharmacy*  Lab Work: NONE   If you have labs (blood work) drawn today and your tests are completely normal, you will receive your results only by: MyChart Message (if you have MyChart) OR A paper copy in the mail If you have any lab test that is abnormal or we need to change your treatment, we will call you to review the results.  Testing/Procedures: NONE   Follow-Up: At [facility], you and your health needs are our priority.  As part of our continuing mission to provide you with exceptional heart care, our providers are all part of one team.  This team includes your primary Cardiologist (physician) and Advanced Practice Providers or APPs (Physician Assistants and Nurse Practitioners) who all work together to provide you with the care you need, when you need it.  Your next appointment:   1 year(s)  Provider:   You may see Oneil Parchment, MD or one of the following Advanced Practice Providers on your designated Care Team:   Laymon Qua, PA-C  [place], [place] Olivia Pavy, [place]     We recommend signing up for the patient portal called MyChart.  Sign up information is provided on this After Visit Summary.  MyChart is used to connect with patients for Virtual Visits (Telemedicine).  Patients are able to view lab/test results, encounter notes, upcoming appointments, etc.  Non-urgent messages can be sent to your provider as well.   To learn more about what you can do with MyChart, go to [email or web address].   Other Instructions Thank you for choosing [facility]!

## 2024-09-17 LAB — COLOGUARD: COLOGUARD: POSITIVE — AB

## 2024-09-18 ENCOUNTER — Encounter: Payer: Self-pay | Admitting: Internal Medicine

## 2024-09-19 ENCOUNTER — Telehealth: Payer: Self-pay | Admitting: Internal Medicine

## 2024-09-19 NOTE — Telephone Encounter (Unsigned)
 Copied from CRM #[id number]. Topic: Referral - Request for Referral >> Sep 19, 2024  2:49 PM Donna BRAVO wrote: Did the patient discuss referral with their provider in the last year? Yes  Appointment offered? Yes  appt on 09/27/24  Type of order/referral and detailed reason for visit: colonoscopy   Preference of office, provider, location: [facility]  If referral order, have you been seen by this specialty before? Yes (If Yes, this issue or another issue? When? Where?  Can we respond through MyChart? Yes

## 2024-09-22 ENCOUNTER — Other Ambulatory Visit: Payer: Self-pay | Admitting: Internal Medicine

## 2024-09-22 DIAGNOSIS — R195 Other fecal abnormalities: Secondary | ICD-10-CM | POA: Insufficient documentation

## 2024-09-27 ENCOUNTER — Ambulatory Visit: Admitting: Internal Medicine

## 2024-09-27 VITALS — BP 98/64 | HR 67 | Ht 70.0 in | Wt 165.0 lb

## 2024-09-27 DIAGNOSIS — I10 Essential (primary) hypertension: Secondary | ICD-10-CM

## 2024-09-27 DIAGNOSIS — E782 Mixed hyperlipidemia: Secondary | ICD-10-CM

## 2024-09-27 DIAGNOSIS — E559 Vitamin D deficiency, unspecified: Secondary | ICD-10-CM

## 2024-09-27 DIAGNOSIS — R195 Other fecal abnormalities: Secondary | ICD-10-CM

## 2024-09-27 DIAGNOSIS — R739 Hyperglycemia, unspecified: Secondary | ICD-10-CM

## 2024-09-27 DIAGNOSIS — N1831 Chronic kidney disease, stage 3a: Secondary | ICD-10-CM

## 2024-09-27 DIAGNOSIS — I255 Ischemic cardiomyopathy: Secondary | ICD-10-CM

## 2024-09-27 DIAGNOSIS — N400 Enlarged prostate without lower urinary tract symptoms: Secondary | ICD-10-CM

## 2024-09-27 NOTE — Assessment & Plan Note (Addendum)
 BP Readings from Last 1 Encounters:  09/27/24 98/64   Well-controlled with Amlodipine  5 mg QD and Metoprolol  50 mg QD DCed amlodipine  due to borderline hypotension in the last visit, but Cardiology office has resumed it for now Counseled for compliance with the medications Advised DASH diet and moderate exercise/walking as tolerated

## 2024-09-27 NOTE — Assessment & Plan Note (Signed)
 Echo reviewed He states that he had LA thrombus. His previous PCP mentioned about it. No known h/o paroxysmal A Fib. Followed by cardiology - continue Eliquis  On statin ACEi and spironolactone  were discontinued due to hypotension Appears euvolemic currently

## 2024-09-27 NOTE — Assessment & Plan Note (Signed)
 Has nocturia, but symptoms manageable for now Check PSA Did not tolerate Flomax  0.4 mg QD

## 2024-09-27 NOTE — Progress Notes (Signed)
 "  Established Patient Office Visit  Subjective:  Patient ID: James Ortega, male    DOB: 1950/12/27  Age: 74 y.o. MRN: [id number]  CC:  Chief Complaint  Patient presents with   Hypertension    Six month follow up     HPI James Ortega is a 74 y.o. male with past medical history of CAD s/p stent placement, ischemic cardiomyopathy, HTN, CKD stage 3a, depression with anxiety and tobacco abuse who presents for f/u of his chronic medical conditions.  BP is low normal. Takes amlodipine  5 mg QD and metoprolol  50 mg QD regularly. Patient denies headache, dizziness, chest pain, dyspnea or palpitations.  He was seen by cardiologist for history of CAD.  He had echo, which did not show any LA or LV thrombus.  He is still taking Eliquis , continued by his cardiologist.  Denies any bleeding issues currently.  He reports urinary hesitancy, frequency and nocturia, which is chronic.  He also reports postvoid dribbling. He had hypotension and dizziness with Flomax . Denies dysuria or hematuria.  He continues to smoke about 0.5 pack per day.   Past Medical History:  Diagnosis Date   History of acute anterior wall MI    Hyperlipidemia    Hypertension     Past Surgical History:  Procedure Laterality Date   AMPUTATION Left 03/24/2016   Procedure: REVISION AMPUTATION LEFT INDEX FINGER;  Surgeon: Elsie Mussel, MD;  Location: [facility] OR;  Service: Orthopedics;  Laterality: Left;   CORONARY ANGIOPLASTY WITH STENT PLACEMENT  05/1998   bare metal stent [facility] [place]    NM MYOCAR PERF WALL MOTION  11/13/2010   No significant ischemia demonstrated. Low risk scan   US  ECHOCARDIOGRAPHY  01/14/2009   Moderate to severe apical wall hypokinesis, trace TR,MR, doppler suggestive of impaired LV relaxation    Family History  Problem Relation Age of Onset   Diabetes Mother    Diabetes Maternal Grandmother    Diabetes Maternal Grandfather     Social History   Socioeconomic History    Marital status: Widowed    Spouse name: Not on file   Number of children: Not on file   Years of education: Not on file   Highest education level: Associate degree: occupational, scientist, product/process development, or vocational program  Occupational History   Not on file  Tobacco Use   Smoking status: Every Day    Current packs/day: 0.50    Types: Cigarettes   Smokeless tobacco: Never  Vaping Use   Vaping status: Never Used  Substance and Sexual Activity   Alcohol use: No    Alcohol/week: 0.0 standard drinks of alcohol   Drug use: No   Sexual activity: Yes  Other Topics Concern   Not on file  Social History Narrative   Not on file   Social Drivers of Health   Tobacco Use: High Risk (09/27/2024)   Patient History    Smoking Tobacco Use: Every Day    Smokeless Tobacco Use: Never    Passive Exposure: Not on file  Financial Resource Strain: Patient Declined (09/27/2024)   Overall Financial Resource Strain (CARDIA)    Difficulty of Paying Living Expenses: Patient declined  Food Insecurity: No Food Insecurity (09/27/2024)   Epic    Worried About Radiation Protection Practitioner of Food in the Last Year: Never true    Ran Out of Food in the Last Year: Never true  Transportation Needs: No Transportation Needs (09/27/2024)   Epic    Lack of Transportation (  Medical): No    Lack of Transportation (Non-Medical): No  Physical Activity: Sufficiently Active (10/04/2023)   Exercise Vital Sign    Days of Exercise per Week: 7 days    Minutes of Exercise per Session: 30 min  Stress: No Stress Concern Present (09/27/2024)   [employer] of Occupational Health - Occupational Stress Questionnaire    Feeling of Stress: Not at all  Social Connections: Unknown (09/27/2024)   Social Connection and Isolation Panel    Frequency of Communication with Friends and Family: Patient declined    Frequency of Social Gatherings with Friends and Family: Patient declined    Attends Religious Services: Patient declined    Database Administrator or  Organizations: Patient declined    Attends Banker Meetings: Not on file    Marital Status: Widowed  Intimate Partner Violence: Not on file  Depression (PHQ2-9): Low Risk (09/27/2024)   Depression (PHQ2-9)    PHQ-2 Score: 0  Alcohol Screen: Not on file  Housing: Low Risk (09/27/2024)   Epic    Unable to Pay for Housing in the Last Year: No    Number of Times Moved in the Last Year: 0    Homeless in the Last Year: No  Utilities: Not on file  Health Literacy: Not on file    Outpatient Medications Prior to Visit  Medication Sig Dispense Refill   amLODipine  (NORVASC ) 5 MG tablet Take 1 tablet (5 mg total) by mouth daily. 90 tablet 3   ELIQUIS  5 MG TABS tablet TAKE 1 TABLET TWICE A DAY 180 tablet 3   metoprolol  succinate (TOPROL -XL) 50 MG 24 hr tablet TAKE 1 TABLET DAILY WITH OR IMMEDIATELY FOLLOWING A MEAL 90 tablet 1   omeprazole  (PRILOSEC) 20 MG capsule TAKE 1 CAPSULE DAILY 90 capsule 3   rosuvastatin  (CRESTOR ) 40 MG tablet Take 1 tablet (40 mg total) by mouth daily. 90 tablet 3   VITAMIN D PO Take 5,000 mg by mouth daily.     vitamin E 400 UNIT capsule Take 800 Units by mouth daily.     fluticasone  (FLONASE ) 50 MCG/ACT nasal spray Place 2 sprays into both nostrils daily. 16 g 6   No facility-administered medications prior to visit.    No Known Allergies  ROS Review of Systems  Constitutional:  Negative for chills and fever.  HENT:  Negative for congestion and sore throat.   Eyes:  Negative for pain and discharge.  Respiratory:  Negative for cough and shortness of breath.   Cardiovascular:  Negative for chest pain and palpitations.  Gastrointestinal:  Negative for abdominal pain, nausea and vomiting.  Endocrine: Negative for polydipsia and polyuria.  Genitourinary:  Negative for dysuria and hematuria.  Musculoskeletal:  Negative for neck pain and neck stiffness.  Skin:  Negative for rash.  Neurological:  Negative for dizziness, weakness, numbness and headaches.   Psychiatric/Behavioral:  Negative for agitation and behavioral problems.       Objective:    Physical Exam Vitals reviewed.  Constitutional:      General: He is not in acute distress.    Appearance: He is not diaphoretic.  HENT:     Head: Normocephalic and atraumatic.     Nose: Nose normal.     Mouth/Throat:     Mouth: Mucous membranes are moist.  Eyes:     General: No scleral icterus.    Extraocular Movements: Extraocular movements intact.  Cardiovascular:     Rate and Rhythm: Normal rate and regular rhythm.  Heart sounds: Normal heart sounds. No murmur heard. Pulmonary:     Breath sounds: Normal breath sounds. No wheezing or rales.  Musculoskeletal:     Cervical back: Neck supple. No tenderness.     Right lower leg: No edema.     Left lower leg: No edema.  Skin:    General: Skin is warm.     Findings: Lesion (Linear depigmented spot over left forearm - about 4 cm X 1 cm) present. No rash.  Neurological:     General: No focal deficit present.     Mental Status: He is alert and oriented to person, place, and time.     Sensory: No sensory deficit.     Motor: No weakness.  Psychiatric:        Mood and Affect: Mood normal.        Behavior: Behavior is cooperative.     BP 98/64   Pulse 67   Ht 5' 10 (1.778 m)   Wt 165 lb (74.8 kg)   SpO2 99%   BMI 23.68 kg/m  Wt Readings from Last 3 Encounters:  09/27/24 165 lb (74.8 kg)  09/06/24 164 lb 3.2 oz (74.5 kg)  04/03/24 165 lb 6.4 oz (75 kg)    Lab Results  Component Value Date   TSH 1.540 10/04/2023   Lab Results  Component Value Date   WBC 11.0 (H) 04/03/2024   HGB 12.7 (L) 04/03/2024   HCT 38.5 04/03/2024   MCV 99 (H) 04/03/2024   PLT 236 04/03/2024   Lab Results  Component Value Date   NA 140 04/03/2024   K 4.1 04/03/2024   CO2 21 04/03/2024   GLUCOSE 109 (H) 04/03/2024   BUN 21 04/03/2024   CREATININE 1.68 (H) 04/03/2024   BILITOT 0.4 10/04/2023   ALKPHOS 78 10/04/2023   AST 30 10/04/2023    ALT 25 10/04/2023   PROT 6.2 10/04/2023   ALBUMIN 4.3 10/04/2023   CALCIUM  9.6 04/03/2024   ANIONGAP 5 07/21/2022   EGFR 43 (L) 04/03/2024   Lab Results  Component Value Date   CHOL 96 (L) 10/04/2023   Lab Results  Component Value Date   HDL 38 (L) 10/04/2023   Lab Results  Component Value Date   LDLCALC 40 10/04/2023   Lab Results  Component Value Date   TRIG 92 10/04/2023   Lab Results  Component Value Date   CHOLHDL 2.5 10/04/2023   Lab Results  Component Value Date   HGBA1C 5.9 (H) 10/04/2023      Assessment & Plan:   Problem List Items Addressed This Visit       Cardiovascular and Mediastinum   Ischemic cardiomyopathy (Chronic)   Echo reviewed He states that he had LA thrombus. His previous PCP mentioned about it. No known h/o paroxysmal A Fib. Followed by cardiology - continue Eliquis  On statin ACEi and spironolactone  were discontinued due to hypotension Appears euvolemic currently      Relevant Orders   TSH   Essential hypertension - Primary (Chronic)   BP Readings from Last 1 Encounters:  09/27/24 98/64   Well-controlled with Amlodipine  5 mg QD and Metoprolol  50 mg QD DCed amlodipine  due to borderline hypotension in the last visit, but Cardiology office has resumed it for now Counseled for compliance with the medications Advised DASH diet and moderate exercise/walking as tolerated      Relevant Orders   TSH   CMP14+EGFR   CBC with Differential/Platelet   Urinalysis  Genitourinary   Stage 3a chronic kidney disease (HCC)   Last BMP reviewed Advised to maintain adequate hydration Avoid nephrotoxic agents Was on lisinopril  and spironolactone , but had to DC due to hypotension Check CMP and CBC      Relevant Orders   CMP14+EGFR   CBC with Differential/Platelet   Urinalysis   Benign prostatic hyperplasia   Has nocturia, but symptoms manageable for now Check PSA Did not tolerate Flomax  0.4 mg QD      Relevant Orders   PSA      Other   Hyperlipidemia (Chronic)   On Crestor  40 mg QD Check lipid profile      Relevant Orders   Lipid panel   Positive colorectal cancer screening using Cologuard test   Referred to GI for colonoscopy      Other Visit Diagnoses       Hyperglycemia       Relevant Orders   Hemoglobin A1c   CMP14+EGFR     Vitamin D deficiency       Relevant Orders   VITAMIN D 25 Hydroxy (Vit-D Deficiency, Fractures)          No orders of the defined types were placed in this encounter.    Follow-up: Return in about 6 months (around 03/27/2025).    Suzzane MARLA Blanch, MD "

## 2024-09-27 NOTE — Assessment & Plan Note (Signed)
On Crestor 40 mg QD Check lipid profile

## 2024-09-27 NOTE — Patient Instructions (Signed)
Please continue to take medications as prescribed. ? ?Please continue to follow low salt diet and ambulate as tolerated. ?

## 2024-09-27 NOTE — Assessment & Plan Note (Signed)
 Last BMP reviewed Advised to maintain adequate hydration Avoid nephrotoxic agents Was on lisinopril  and spironolactone , but had to DC due to hypotension Check CMP and CBC

## 2024-09-27 NOTE — Assessment & Plan Note (Signed)
 Referred to GI for colonoscopy

## 2024-09-28 ENCOUNTER — Telehealth: Payer: Self-pay | Admitting: Gastroenterology

## 2024-09-28 ENCOUNTER — Encounter: Payer: Self-pay | Admitting: Gastroenterology

## 2024-09-28 ENCOUNTER — Encounter: Payer: Self-pay | Admitting: Internal Medicine

## 2024-09-28 ENCOUNTER — Other Ambulatory Visit: Payer: Self-pay

## 2024-09-28 ENCOUNTER — Ambulatory Visit: Payer: Self-pay | Admitting: Internal Medicine

## 2024-09-28 DIAGNOSIS — K219 Gastro-esophageal reflux disease without esophagitis: Secondary | ICD-10-CM

## 2024-09-28 LAB — CMP14+EGFR
ALT: 18 [IU]/L (ref 0–44)
AST: 29 [IU]/L (ref 0–40)
Albumin: 4.3 g/dL (ref 3.8–4.8)
Alkaline Phosphatase: 68 [IU]/L (ref 47–123)
BUN/Creatinine Ratio: 14 (ref 10–24)
BUN: 25 mg/dL (ref 8–27)
Bilirubin Total: 0.5 mg/dL (ref 0.0–1.2)
CO2: 22 mmol/L (ref 20–29)
Calcium: 9.7 mg/dL (ref 8.6–10.2)
Chloride: 103 mmol/L (ref 96–106)
Creatinine, Ser: 1.75 mg/dL — ABNORMAL HIGH (ref 0.76–1.27)
Globulin, Total: 2.2 g/dL (ref 1.5–4.5)
Glucose: 89 mg/dL (ref 70–99)
Potassium: 4.6 mmol/L (ref 3.5–5.2)
Sodium: 140 mmol/L (ref 134–144)
Total Protein: 6.5 g/dL (ref 6.0–8.5)
eGFR: 41 mL/min/{1.73_m2} — ABNORMAL LOW

## 2024-09-28 LAB — VITAMIN D 25 HYDROXY (VIT D DEFICIENCY, FRACTURES): Vit D, 25-Hydroxy: 54 ng/mL (ref 30.0–100.0)

## 2024-09-28 LAB — HEMOGLOBIN A1C
Est. average glucose Bld gHb Est-mCnc: 117 mg/dL
Hgb A1c MFr Bld: 5.7 % — ABNORMAL HIGH (ref 4.8–5.6)

## 2024-09-28 LAB — CBC WITH DIFFERENTIAL/PLATELET
Basophils Absolute: 0.1 10*3/uL (ref 0.0–0.2)
Basos: 1 %
EOS (ABSOLUTE): 0.2 10*3/uL (ref 0.0–0.4)
Eos: 2 %
Hematocrit: 36.5 % — ABNORMAL LOW (ref 37.5–51.0)
Hemoglobin: 12.4 g/dL — ABNORMAL LOW (ref 13.0–17.7)
Immature Grans (Abs): 0 10*3/uL (ref 0.0–0.1)
Immature Granulocytes: 0 %
Lymphocytes Absolute: 1.8 10*3/uL (ref 0.7–3.1)
Lymphs: 19 %
MCH: 32.7 pg (ref 26.6–33.0)
MCHC: 34 g/dL (ref 31.5–35.7)
MCV: 96 fL (ref 79–97)
Monocytes Absolute: 0.8 10*3/uL (ref 0.1–0.9)
Monocytes: 8 %
Neutrophils Absolute: 6.7 10*3/uL (ref 1.4–7.0)
Neutrophils: 70 %
Platelets: 279 10*3/uL (ref 150–450)
RBC: 3.79 x10E6/uL — ABNORMAL LOW (ref 4.14–5.80)
RDW: 13.8 % (ref 11.6–15.4)
WBC: 9.6 10*3/uL (ref 3.4–10.8)

## 2024-09-28 LAB — LIPID PANEL
Chol/HDL Ratio: 3.2 ratio (ref 0.0–5.0)
Cholesterol, Total: 123 mg/dL (ref 100–199)
HDL: 39 mg/dL — ABNORMAL LOW
LDL Chol Calc (NIH): 64 mg/dL (ref 0–99)
Triglycerides: 105 mg/dL (ref 0–149)
VLDL Cholesterol Cal: 20 mg/dL (ref 5–40)

## 2024-09-28 LAB — TSH: TSH: 1.47 u[IU]/mL (ref 0.450–4.500)

## 2024-09-28 LAB — PSA: Prostate Specific Ag, Serum: 2.5 ng/mL (ref 0.0–4.0)

## 2024-09-28 MED ORDER — OMEPRAZOLE 20 MG PO CPDR: 20.0000 mg | DELAYED_RELEASE_CAPSULE | Freq: Every day | ORAL | 3 refills | Status: AC

## 2024-09-28 NOTE — Telephone Encounter (Signed)
 error

## 2024-09-28 NOTE — Telephone Encounter (Signed)
 Sent to pharmacy

## 2024-10-04 ENCOUNTER — Ambulatory Visit: Admitting: Internal Medicine

## 2024-11-07 ENCOUNTER — Ambulatory Visit: Admitting: Gastroenterology

## 2025-03-27 ENCOUNTER — Ambulatory Visit: Payer: Self-pay | Admitting: Internal Medicine
